# Patient Record
Sex: Male | Born: 1945
Health system: Southern US, Community
[De-identification: ages and names within clinical notes are randomized; demographics above are authoritative.]

## PROBLEM LIST (undated history)

## (undated) DIAGNOSIS — I10 Essential (primary) hypertension: Secondary | ICD-10-CM

## (undated) DIAGNOSIS — N189 Chronic kidney disease, unspecified: Secondary | ICD-10-CM

## (undated) HISTORY — DX: Chronic kidney disease, unspecified: N18.9

---

## 2005-11-10 ENCOUNTER — Ambulatory Visit (HOSPITAL_COMMUNITY): Admission: RE | Admit: 2005-11-10 | Discharge: 2005-11-10 | Payer: Self-pay | Admitting: *Deleted

## 2005-11-10 ENCOUNTER — Encounter (INDEPENDENT_AMBULATORY_CARE_PROVIDER_SITE_OTHER): Payer: Self-pay | Admitting: *Deleted

## 2006-11-23 ENCOUNTER — Emergency Department (HOSPITAL_COMMUNITY): Admission: EM | Admit: 2006-11-23 | Discharge: 2006-11-23 | Payer: Self-pay | Admitting: Emergency Medicine

## 2006-12-25 ENCOUNTER — Ambulatory Visit (HOSPITAL_BASED_OUTPATIENT_CLINIC_OR_DEPARTMENT_OTHER): Admission: RE | Admit: 2006-12-25 | Discharge: 2006-12-25 | Payer: Self-pay | Admitting: Urology

## 2007-02-26 ENCOUNTER — Encounter: Admission: RE | Admit: 2007-02-26 | Discharge: 2007-02-26 | Payer: Self-pay | Admitting: Internal Medicine

## 2007-09-26 ENCOUNTER — Encounter: Admission: RE | Admit: 2007-09-26 | Discharge: 2007-09-26 | Payer: Self-pay | Admitting: Internal Medicine

## 2008-08-04 ENCOUNTER — Encounter: Admission: RE | Admit: 2008-08-04 | Discharge: 2008-08-04 | Payer: Self-pay | Admitting: Internal Medicine

## 2009-02-09 ENCOUNTER — Ambulatory Visit (HOSPITAL_COMMUNITY): Admission: RE | Admit: 2009-02-09 | Discharge: 2009-02-09 | Payer: Self-pay | Admitting: Urology

## 2010-07-05 ENCOUNTER — Encounter: Payer: Self-pay | Admitting: Internal Medicine

## 2010-09-12 IMAGING — CT CT ABDOMEN WO/W CM
2 of 9 series · 13 of 46 positions shown, 18 images · IV contrast (agent unspecified)
Comparison: CT abdomen pelvis of 11/23/2006

CT ABDOMEN

CLINICAL DATA: Painless hematuria for 1 day, history of kidney
stones

CT ABDOMEN WITHOUT AND WITH CONTRAST
CT PELVIS WITH CONTRAST
TECHNIQUE: Multidetector CT imaging of the abdomen was performed
initially following the standard protocol before administration of
intravenous contrast.  Multidetector CT imaging of the abdomen and
pelvis was then performed following the standard protocol during
the bolus injection of intravenous contrast.
Contrast: 100 ml [id number]-5FF

[Series 5: routine abdomen · axial · 0.70mm/px · z∈[-382,-67]mm · 10 of 71 slices shown, 15 images]
[im 6/71  soft-tissue]
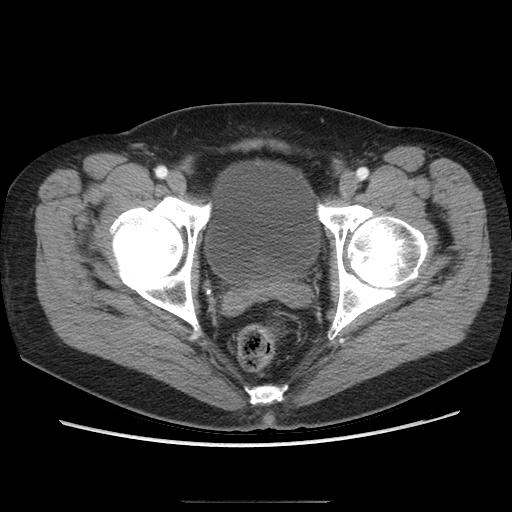
[im 6/71  bone]
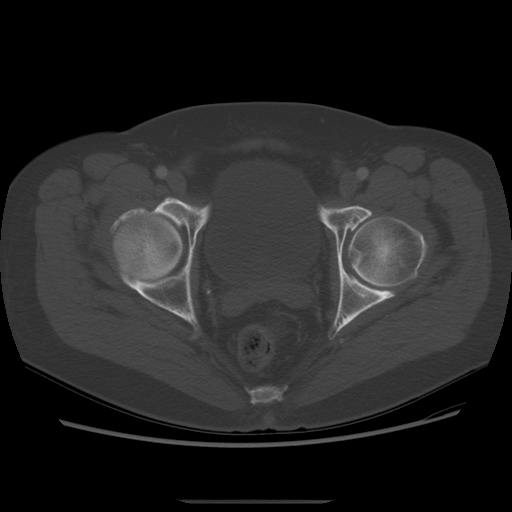
[im 12/71  soft-tissue]
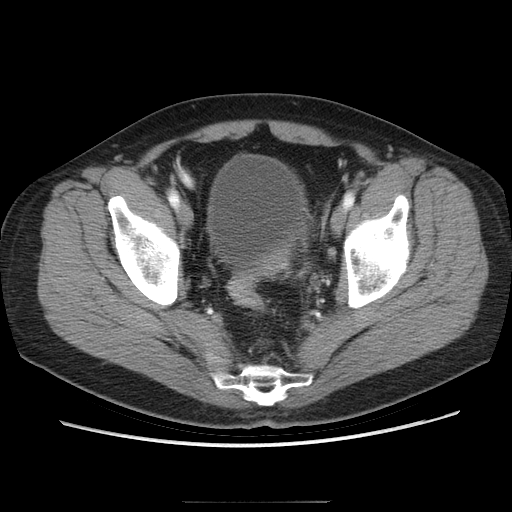
[im 24/71  soft-tissue]
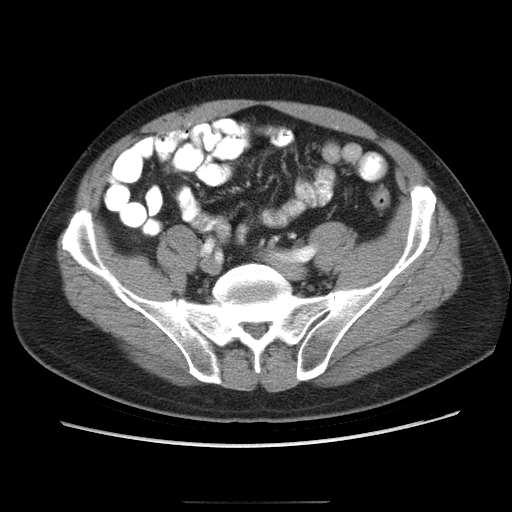
[im 30/71  soft-tissue]
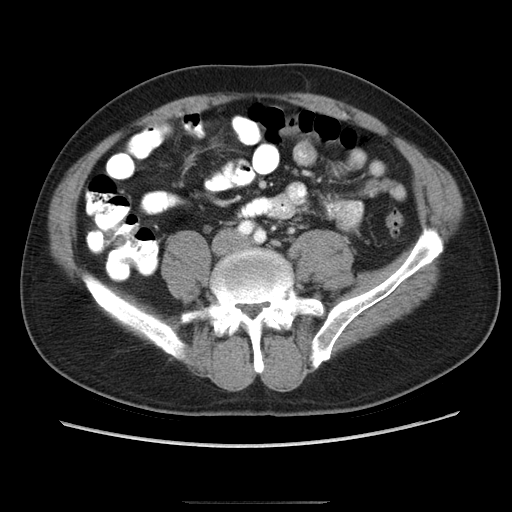
[im 36/71  soft-tissue]
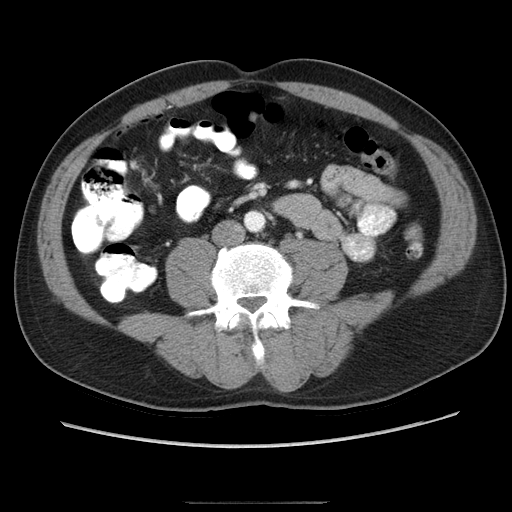
[im 41/71  soft-tissue]
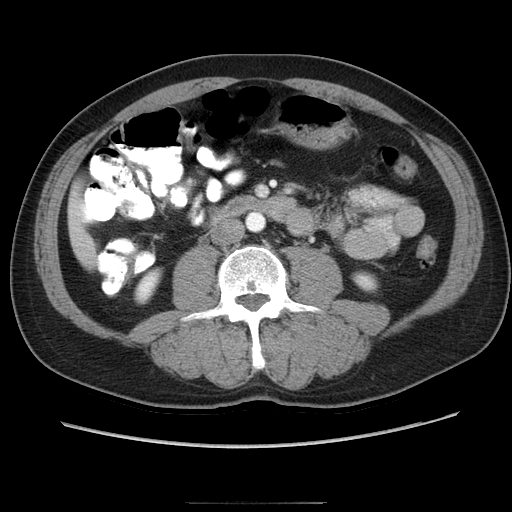
[im 47/71  soft-tissue]
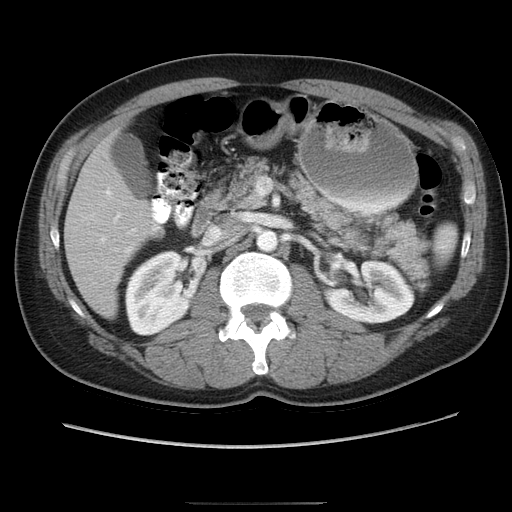
[im 47/71  lung]
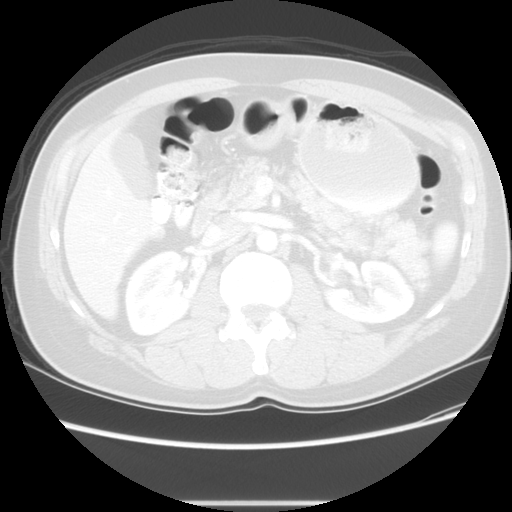
[im 53/71  lung]
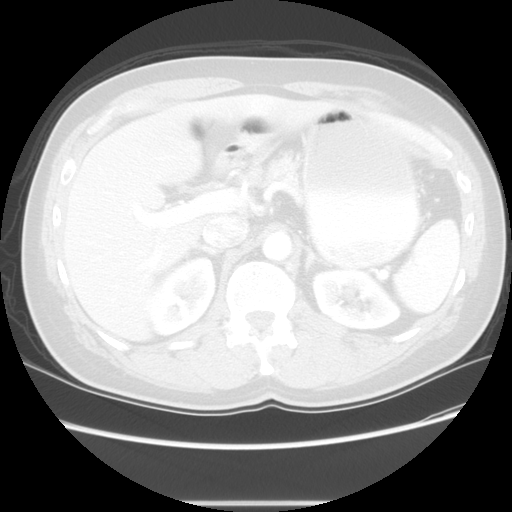
[im 59/71  soft-tissue]
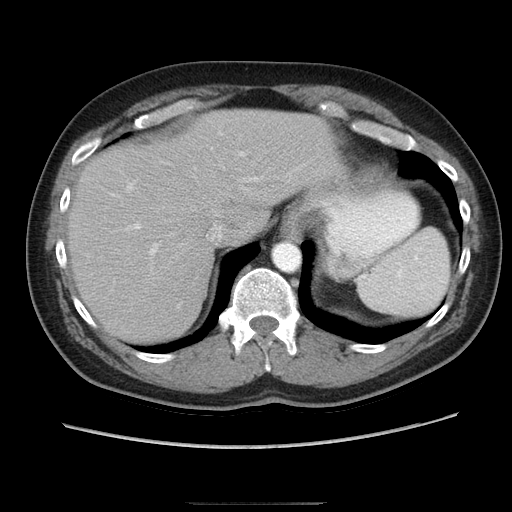
[im 59/71  lung]
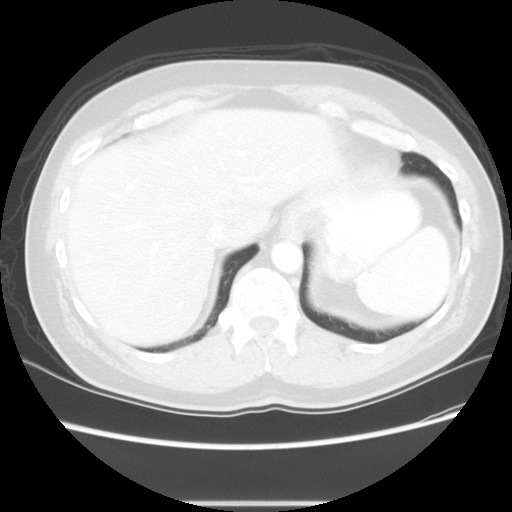
[im 65/71  soft-tissue]
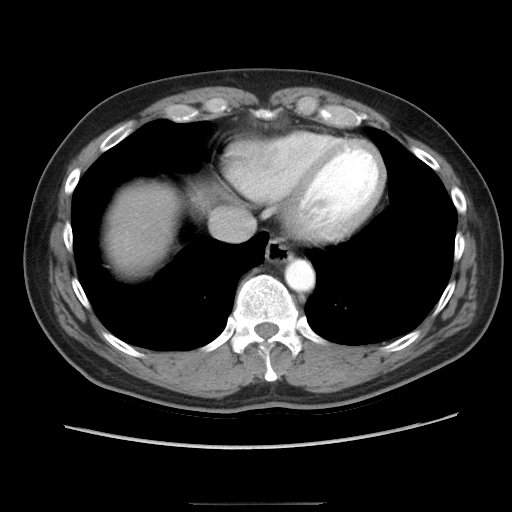
[im 65/71  lung]
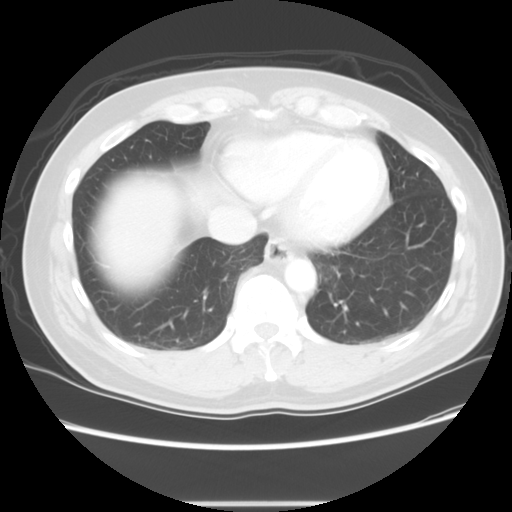
[im 65/71  bone]
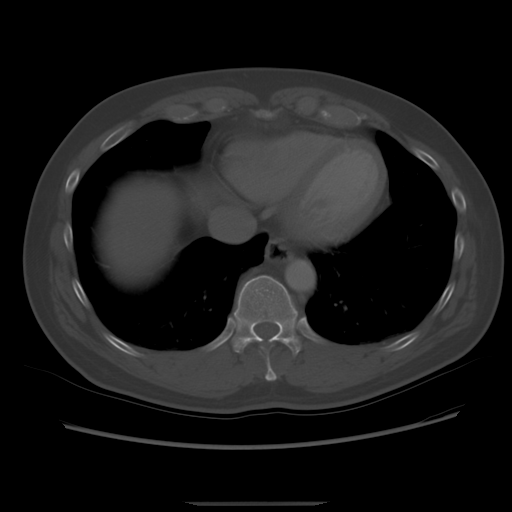

[Series 602: sagittal body · sagittal · 0.70mm/px · 3 of 144 slices shown]
[im 36/144  soft-tissue]
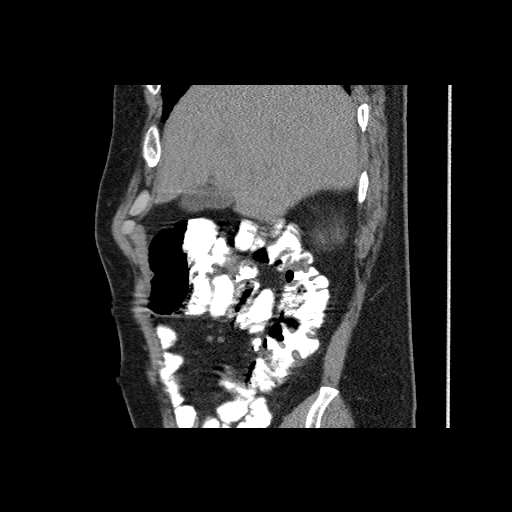
[im 72/144  soft-tissue]
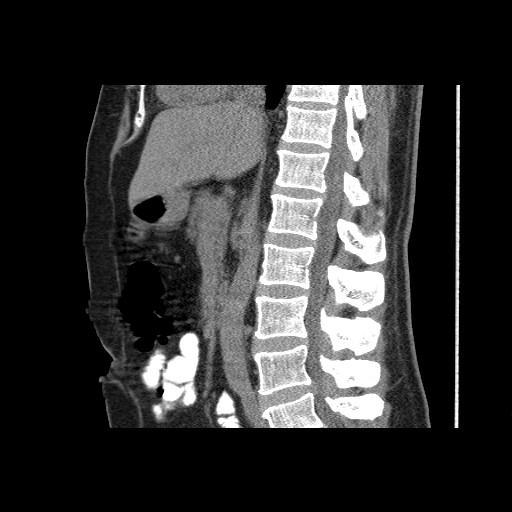
[im 108/144  soft-tissue]
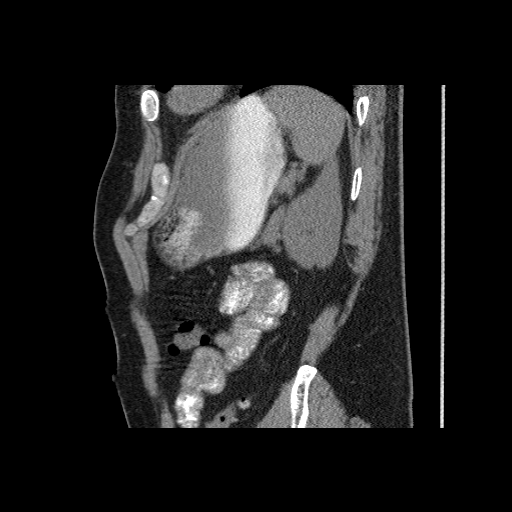

[13 of 46 positions shown; findings below may reference images not displayed]

FINDINGS: The lung bases are clear.  On the unenhanced images ,
there is a nonobstructing 5 mm calculus in the lower pole of the
left collecting system.  In addition there is an 8 x 6 mm calculus
within the left pelvis near the left ureter - pelvic junction which
creates very slight fullness on the left.  Only a tiny right renal
calculus is noted.

The liver enhances with no focal abnormality and no ductal
dilatation is seen.  No calcified gallstones are noted.  The
pancreas is normal in size, and the pancreatic duct is not dilated.
The adrenal glands and spleen appear normal.  The kidneys enhance
and and the previously noted left lower pole and left ureteropelvic
junction calculi are noted.  No renal mass is seen.  The abdominal
aorta is normal in caliber.  No adenopathy is seen.
IMPRESSION: 1.  8 x 6 mm calculus at the left ureter - pelvic junction creating
very low grade obstruction on the left.
2.  5 mm nonobstructing left lower pole renal calculus with tiny
right renal calculus as well.

CT PELVIS
FINDINGS: The ureters are normal in caliber and no distal ureteral
calculi are seen.  The prostate is slightly prominent.  There are
rectosigmoid colonic diverticula present with some thickening of
the mucosa most consistent with diverticulosis.  However with
slight pericolonic strandiness surrounding a portion of the sigmoid
colon, mild diverticulitis is a definite consideration.  The
urinary bladder is unremarkable.  No free fluid is seen. The
appendix and the terminal ileum are well visualized and appear
normal.
IMPRESSION: 1.  Mild diverticulitis of the sigmoid colon.  Multiple
rectosigmoid colonic diverticula.
2.  No distal ureteral calculi are seen.

## 2010-10-26 NOTE — Op Note (Signed)
[name], [name]                ACCOUNT NO.:  [id number]   MEDICAL RECORD NO.:  [id number]          PATIENT TYPE:  AMB   LOCATION:  [facility]                         FACILITY:  [facility]   PHYSICIAN:  Mark C. Vernie Ammons, M.D.  DATE OF BIRTH:  04-27-1946   DATE OF PROCEDURE:  12/25/2006  DATE OF DISCHARGE:                               OPERATIVE REPORT   PREOPERATIVE DIAGNOSIS:  Left distal ureteral calculus.   POSTOPERATIVE DIAGNOSIS:  Left distal ureteral calculus.   OPERATION PERFORMED:  Cystoscopy, left retrograde pyelogram with  interpretation, left ureteroscopy and stone extraction with double-J  stent placement.   SURGEON:  Mark C. Vernie Ammons, M.D.   ANESTHESIA:  General.   DRAINS:  4.8 French 26 cm double-J stent in left ureter (with string).   SPECIMENS:  Stone given to patient.   BLOOD LOSS:  None.   COMPLICATIONS:  None.   INDICATIONS FOR PROCEDURE:  The patient is a 65 year old male with a  left distal ureteral stone that has been present for several weeks and  has not progressed over time.  He has demonstrated hydronephrosis and we  discussed extraction of the stone, its risks and complications.  He has  elected to proceed.   DESCRIPTION OF PROCEDURE:  After informed consent the patient brought to  the major operating room, placed on table, administered general  anesthesia, then moved to the dorsal lithotomy position.  His genitalia  were sterilely prepped and draped.  Initially, a 22 French cystoscope  was introduced into the bladder with a 12 degree lens.  The urethra was  noted to be normal.  The prostatic urethra revealed some bilobar  hypertrophy but no lesions.  Upon entering the bladder, the bladder was  fully inspected and noted to be free of any tumor, stones or  inflammatory lesions.  Ureteral orifices normal in configuration and  position.   Left orifice was identified and a 0.038 inch floppy tip guidewire was  then passed up the left ureter under direct  fluoroscopic control.  The  stone also could be visualized easily in the ureter fluoroscopically.   The guidewire was left in place and I used the ureteral access sheath  and used the inner cannula to gently dilate the distal ureter.  After  doing this, I introduced a 6 French rigid ureteroscope per urethra under  direct visualization and then passed this up the left ureter easily.  I  was able to identify the stone.  It was photographed and then grasped  with Nitinol basket and extracted without difficulty.   I then reinserted the ureteroscope, noted no injury to the ureter and  therefore I advanced the scope slightly up the ureter.  I then performed  a retrograde pyelogram.   Full strength contrast was then injected through the ureteroscope to  perform the pyelogram.  This was done under direct fluoroscopic control  and revealed proximal hydronephrosis and some narrowing distally to  where the stone was located.  No filling defects or other abnormality  were noted proximally.   The guidewire was then passed through the ureteroscope again and  left in  place and the ureteroscope removed  and the cystoscope reinserted, back  loading this over the guidewire.  The stent was then passed over the  guidewire into the area of the renal pelvis.  Good curl was noted in the  renal pelvis and when the guidewire was removed and good curl was noted  in the bladder as well.  The bladder was then drained and the patient  was awakened and taken to recovery room in stable satisfactory  condition.  The patient tolerated the procedure well.  There were no  intraoperative complications.  The stent will need to be in place for  five to seven days.  He will then return to my office for its removal.  The string was left affixed.  He was given a prescription for 36  Pyridium plus and will take Vicodin HP one to two every four hours as  needed, #20.  He will also remain on his Flomax for now.  He will bring   his stone back for stone analysis when he returns.      Mark C. Vernie Ammons, M.D.  Electronically Signed     MCO/MEDQ  D:  12/25/2006  T:  12/26/2006  Job:  [id number]

## 2010-10-29 NOTE — Op Note (Signed)
[name], [name]                ACCOUNT NO.:  [id number]   MEDICAL RECORD NO.:  [id number]          PATIENT TYPE:  AMB   LOCATION:  ENDO                         FACILITY:  [facility]   PHYSICIAN:  Georgiana Spinner, M.D.    DATE OF BIRTH:  07/07/1945   DATE OF PROCEDURE:  11/10/2005  DATE OF DISCHARGE:                                 OPERATIVE REPORT   PROCEDURE:  Colonoscopy.   INDICATIONS:  Rectal bleeding colon polyps.   ANESTHESIA:  Demerol 60, Versed 5 mg.   PROCEDURE:  With the patient mildly sedated in the left lateral decubitus  position, a rectal exam was performed which was unremarkable.  Subsequently  the Olympus videoscopic colonoscope was inserted in the rectum and passed  under direct vision to the cecum identified by the ileocecal valve and  appendiceal orifice both of which were photographed.  There was a small  polyp in the cecum that was photographed and removed using hot biopsy  forceps technique setting of 20/200 blended current.  We then withdrew the  colonoscope taking circumferential views of the colonic mucosa stopping only  in the rectum which appeared normal on direct except for a small polyp that  was removed gain using hot biopsy forceps technique setting the same 20/200  blended current and appeared normal on retroflexed view. The endoscope was  straightened and withdrawn.  The patient's vital signs and pulse oximeter  remained stable.  The patient tolerated the procedure well without apparent  complications.   FINDINGS:  Two small polyps, one in the rectum and one in the cecum,  otherwise an unremarkable examination.  Await biopsy report.  The patient  will call me for results and follow-up with me as an outpatient.           ______________________________  Georgiana Spinner, M.D.     GMO/MEDQ  D:  11/10/2005  T:  11/10/2005  Job:  [id number]

## 2010-11-23 HISTORY — PX: IRRIGATION AND DEBRIDEMENT SEBACEOUS CYST: SHX5255

## 2010-12-07 ENCOUNTER — Ambulatory Visit (INDEPENDENT_AMBULATORY_CARE_PROVIDER_SITE_OTHER): Payer: BC Managed Care – PPO | Admitting: General Surgery

## 2010-12-07 ENCOUNTER — Encounter (INDEPENDENT_AMBULATORY_CARE_PROVIDER_SITE_OTHER): Payer: Self-pay | Admitting: General Surgery

## 2010-12-07 VITALS — BP 130/76 | HR 78 | Temp 97.2°F | Resp 18 | Wt 173.0 lb

## 2010-12-07 DIAGNOSIS — L723 Sebaceous cyst: Secondary | ICD-10-CM

## 2010-12-07 NOTE — Patient Instructions (Signed)
Return to all normal activities!

## 2010-12-08 ENCOUNTER — Encounter (INDEPENDENT_AMBULATORY_CARE_PROVIDER_SITE_OTHER): Payer: Self-pay | Admitting: General Surgery

## 2010-12-08 NOTE — Progress Notes (Signed)
Subjective:     Patient ID: [name], male   DOB: 11/14/1945, 65 y.o.   MRN: [id number]    BP 130/76  Pulse 78  Temp(Src) 97.2 F (36.2 C) (Temporal)  Resp 18  Wt 173 lb (78.472 kg)    HPI Postop from an excision of a sebaceous cyst from the left neck. Doing well. No complaints.  Review of Systems     Objective:   Physical Exam Incision has healed well    Assessment:     Postop    Plan:     May return to all normal activities.

## 2010-12-10 ENCOUNTER — Encounter (INDEPENDENT_AMBULATORY_CARE_PROVIDER_SITE_OTHER): Payer: Self-pay | Admitting: General Surgery

## 2010-12-13 ENCOUNTER — Encounter (INDEPENDENT_AMBULATORY_CARE_PROVIDER_SITE_OTHER): Payer: Self-pay | Admitting: General Surgery

## 2010-12-16 NOTE — Progress Notes (Signed)
This encounter was created in error - please disregard.

## 2011-03-20 IMAGING — CR DG ABDOMEN 1V
1 series · 1 of 1 positions shown · non-contrast
Comparison: CT 08/04/2008

CLINICAL DATA: Left UPJ stone.

ABDOMEN - 1 VIEW

[t abdomen supine]
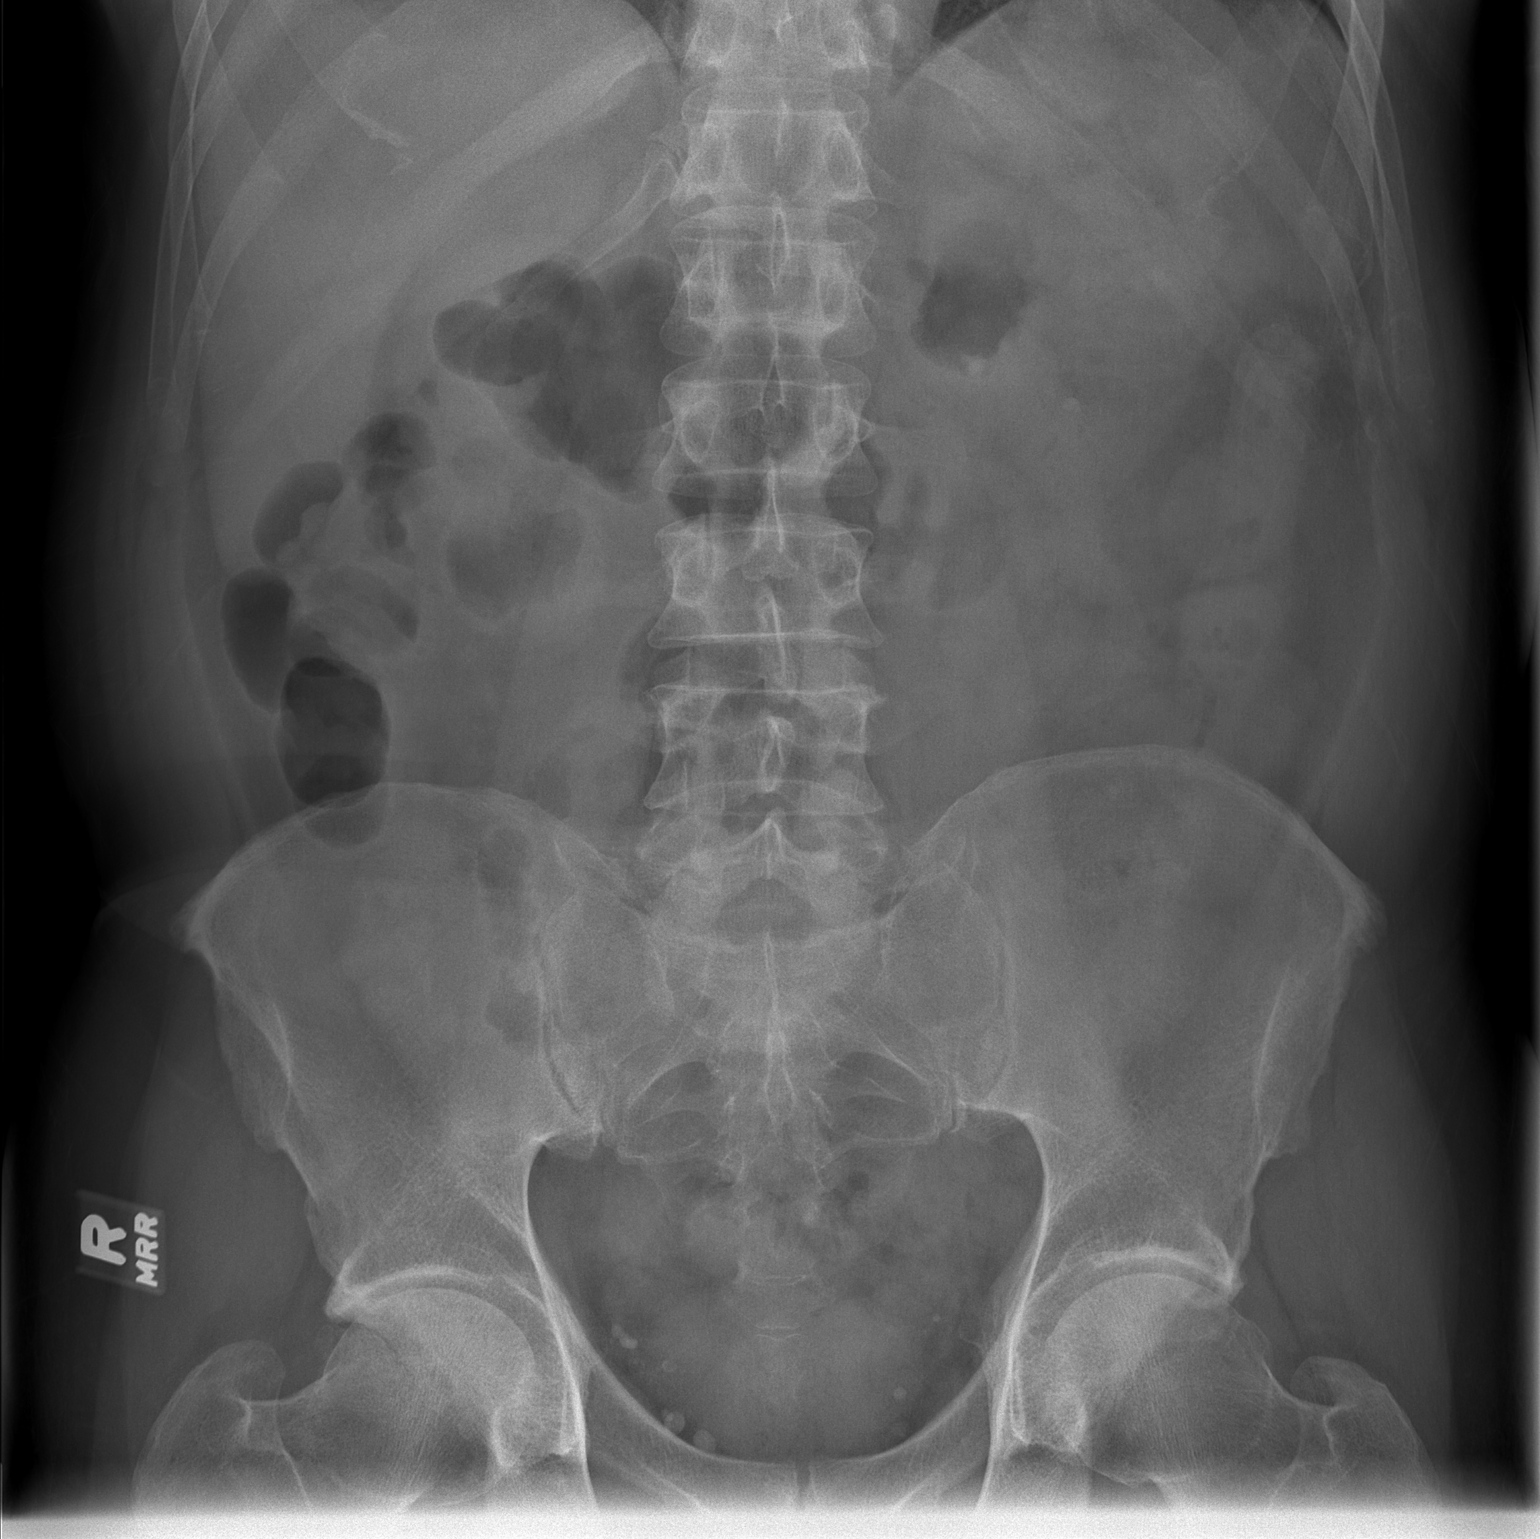

[1 of 1 positions shown; findings below may reference images not displayed]

FINDINGS: 5 mm stone projects over the lower pole of the left
kidney.  6 mm stone projects in the region of the left UPJ.  No
additional suspicious calcifications.  Bowel gas pattern is normal.
IMPRESSION: Left UPJ and left lower pole stone as described above.

## 2011-03-29 LAB — POCT HEMOGLOBIN-HEMACUE
Hemoglobin: 14.6
Operator id: 268271

## 2011-03-31 LAB — COMPREHENSIVE METABOLIC PANEL
ALT: 11
AST: 21
CO2: 26
Calcium: 8.8
GFR calc Af Amer: 56 — ABNORMAL LOW
Sodium: 136
Total Protein: 6.8

## 2011-03-31 LAB — CBC
MCHC: 33.3
RBC: 4.85
RDW: 13.2

## 2011-03-31 LAB — URINE MICROSCOPIC-ADD ON

## 2011-03-31 LAB — URINALYSIS, ROUTINE W REFLEX MICROSCOPIC
Bilirubin Urine: NEGATIVE
Nitrite: NEGATIVE
Specific Gravity, Urine: 1.02
pH: 7

## 2011-03-31 LAB — DIFFERENTIAL
Eosinophils Absolute: 0.3
Eosinophils Relative: 2
Lymphs Abs: 0.9
Monocytes Absolute: 0.6
Monocytes Relative: 4

## 2011-11-08 DIAGNOSIS — Z0000 Encounter for general adult medical examination without abnormal findings: Secondary | ICD-10-CM | POA: Diagnosis not present

## 2011-11-08 DIAGNOSIS — Z125 Encounter for screening for malignant neoplasm of prostate: Secondary | ICD-10-CM | POA: Diagnosis not present

## 2011-12-22 ENCOUNTER — Other Ambulatory Visit: Payer: Self-pay | Admitting: Gastroenterology

## 2011-12-22 DIAGNOSIS — Z1211 Encounter for screening for malignant neoplasm of colon: Secondary | ICD-10-CM | POA: Diagnosis not present

## 2011-12-22 DIAGNOSIS — D126 Benign neoplasm of colon, unspecified: Secondary | ICD-10-CM | POA: Diagnosis not present

## 2011-12-22 DIAGNOSIS — K5730 Diverticulosis of large intestine without perforation or abscess without bleeding: Secondary | ICD-10-CM | POA: Diagnosis not present

## 2012-11-26 DIAGNOSIS — Z0000 Encounter for general adult medical examination without abnormal findings: Secondary | ICD-10-CM | POA: Diagnosis not present

## 2012-11-26 DIAGNOSIS — Z125 Encounter for screening for malignant neoplasm of prostate: Secondary | ICD-10-CM | POA: Diagnosis not present

## 2012-12-20 DIAGNOSIS — R5381 Other malaise: Secondary | ICD-10-CM | POA: Diagnosis not present

## 2012-12-20 DIAGNOSIS — G43909 Migraine, unspecified, not intractable, without status migrainosus: Secondary | ICD-10-CM | POA: Diagnosis not present

## 2012-12-20 DIAGNOSIS — R5383 Other fatigue: Secondary | ICD-10-CM | POA: Diagnosis not present

## 2012-12-20 DIAGNOSIS — M1990 Unspecified osteoarthritis, unspecified site: Secondary | ICD-10-CM | POA: Diagnosis not present

## 2014-01-14 DIAGNOSIS — H43819 Vitreous degeneration, unspecified eye: Secondary | ICD-10-CM | POA: Diagnosis not present

## 2014-01-21 DIAGNOSIS — R5381 Other malaise: Secondary | ICD-10-CM | POA: Diagnosis not present

## 2014-01-21 DIAGNOSIS — Z0000 Encounter for general adult medical examination without abnormal findings: Secondary | ICD-10-CM | POA: Diagnosis not present

## 2014-01-21 DIAGNOSIS — R5383 Other fatigue: Secondary | ICD-10-CM | POA: Diagnosis not present

## 2014-01-21 DIAGNOSIS — G43909 Migraine, unspecified, not intractable, without status migrainosus: Secondary | ICD-10-CM | POA: Diagnosis not present

## 2014-01-21 DIAGNOSIS — Z125 Encounter for screening for malignant neoplasm of prostate: Secondary | ICD-10-CM | POA: Diagnosis not present

## 2014-01-27 DIAGNOSIS — G43909 Migraine, unspecified, not intractable, without status migrainosus: Secondary | ICD-10-CM | POA: Diagnosis not present

## 2014-01-27 DIAGNOSIS — Z23 Encounter for immunization: Secondary | ICD-10-CM | POA: Diagnosis not present

## 2014-01-27 DIAGNOSIS — M1990 Unspecified osteoarthritis, unspecified site: Secondary | ICD-10-CM | POA: Diagnosis not present

## 2014-01-27 DIAGNOSIS — Z0000 Encounter for general adult medical examination without abnormal findings: Secondary | ICD-10-CM | POA: Diagnosis not present

## 2014-02-25 DIAGNOSIS — H2510 Age-related nuclear cataract, unspecified eye: Secondary | ICD-10-CM | POA: Diagnosis not present

## 2014-02-25 DIAGNOSIS — H43819 Vitreous degeneration, unspecified eye: Secondary | ICD-10-CM | POA: Diagnosis not present

## 2014-07-01 DIAGNOSIS — H2513 Age-related nuclear cataract, bilateral: Secondary | ICD-10-CM | POA: Diagnosis not present

## 2014-07-29 DIAGNOSIS — I10 Essential (primary) hypertension: Secondary | ICD-10-CM | POA: Diagnosis not present

## 2014-07-29 DIAGNOSIS — G43009 Migraine without aura, not intractable, without status migrainosus: Secondary | ICD-10-CM | POA: Diagnosis not present

## 2014-10-27 DIAGNOSIS — G43009 Migraine without aura, not intractable, without status migrainosus: Secondary | ICD-10-CM | POA: Diagnosis not present

## 2014-10-27 DIAGNOSIS — I10 Essential (primary) hypertension: Secondary | ICD-10-CM | POA: Diagnosis not present

## 2015-02-20 DIAGNOSIS — G43009 Migraine without aura, not intractable, without status migrainosus: Secondary | ICD-10-CM | POA: Diagnosis not present

## 2015-02-20 DIAGNOSIS — I10 Essential (primary) hypertension: Secondary | ICD-10-CM | POA: Diagnosis not present

## 2015-02-20 DIAGNOSIS — Z125 Encounter for screening for malignant neoplasm of prostate: Secondary | ICD-10-CM | POA: Diagnosis not present

## 2015-02-26 DIAGNOSIS — I10 Essential (primary) hypertension: Secondary | ICD-10-CM | POA: Diagnosis not present

## 2015-02-26 DIAGNOSIS — R5383 Other fatigue: Secondary | ICD-10-CM | POA: Diagnosis not present

## 2015-02-26 DIAGNOSIS — G43009 Migraine without aura, not intractable, without status migrainosus: Secondary | ICD-10-CM | POA: Diagnosis not present

## 2015-05-14 DIAGNOSIS — J069 Acute upper respiratory infection, unspecified: Secondary | ICD-10-CM | POA: Diagnosis not present

## 2015-05-14 DIAGNOSIS — R05 Cough: Secondary | ICD-10-CM | POA: Diagnosis not present

## 2015-05-26 DIAGNOSIS — H6983 Other specified disorders of Eustachian tube, bilateral: Secondary | ICD-10-CM | POA: Diagnosis not present

## 2015-07-27 DIAGNOSIS — J069 Acute upper respiratory infection, unspecified: Secondary | ICD-10-CM | POA: Diagnosis not present

## 2016-03-17 DIAGNOSIS — R5383 Other fatigue: Secondary | ICD-10-CM | POA: Diagnosis not present

## 2016-03-17 DIAGNOSIS — Z125 Encounter for screening for malignant neoplasm of prostate: Secondary | ICD-10-CM | POA: Diagnosis not present

## 2016-03-17 DIAGNOSIS — I10 Essential (primary) hypertension: Secondary | ICD-10-CM | POA: Diagnosis not present

## 2016-03-17 DIAGNOSIS — Z0000 Encounter for general adult medical examination without abnormal findings: Secondary | ICD-10-CM | POA: Diagnosis not present

## 2016-03-17 DIAGNOSIS — G43009 Migraine without aura, not intractable, without status migrainosus: Secondary | ICD-10-CM | POA: Diagnosis not present

## 2016-03-17 DIAGNOSIS — Z23 Encounter for immunization: Secondary | ICD-10-CM | POA: Diagnosis not present

## 2016-03-24 DIAGNOSIS — G43009 Migraine without aura, not intractable, without status migrainosus: Secondary | ICD-10-CM | POA: Diagnosis not present

## 2016-03-24 DIAGNOSIS — M150 Primary generalized (osteo)arthritis: Secondary | ICD-10-CM | POA: Diagnosis not present

## 2016-03-24 DIAGNOSIS — R5383 Other fatigue: Secondary | ICD-10-CM | POA: Diagnosis not present

## 2016-03-24 DIAGNOSIS — Z23 Encounter for immunization: Secondary | ICD-10-CM | POA: Diagnosis not present

## 2016-03-24 DIAGNOSIS — I10 Essential (primary) hypertension: Secondary | ICD-10-CM | POA: Diagnosis not present

## 2017-04-06 DIAGNOSIS — R5383 Other fatigue: Secondary | ICD-10-CM | POA: Diagnosis not present

## 2017-04-06 DIAGNOSIS — Z23 Encounter for immunization: Secondary | ICD-10-CM | POA: Diagnosis not present

## 2017-04-06 DIAGNOSIS — I10 Essential (primary) hypertension: Secondary | ICD-10-CM | POA: Diagnosis not present

## 2017-04-06 DIAGNOSIS — Z0000 Encounter for general adult medical examination without abnormal findings: Secondary | ICD-10-CM | POA: Diagnosis not present

## 2017-04-06 DIAGNOSIS — Z125 Encounter for screening for malignant neoplasm of prostate: Secondary | ICD-10-CM | POA: Diagnosis not present

## 2017-04-13 DIAGNOSIS — I10 Essential (primary) hypertension: Secondary | ICD-10-CM | POA: Diagnosis not present

## 2017-04-13 DIAGNOSIS — R5383 Other fatigue: Secondary | ICD-10-CM | POA: Diagnosis not present

## 2017-04-13 DIAGNOSIS — M150 Primary generalized (osteo)arthritis: Secondary | ICD-10-CM | POA: Diagnosis not present

## 2017-04-13 DIAGNOSIS — N529 Male erectile dysfunction, unspecified: Secondary | ICD-10-CM | POA: Diagnosis not present

## 2017-04-13 DIAGNOSIS — G43009 Migraine without aura, not intractable, without status migrainosus: Secondary | ICD-10-CM | POA: Diagnosis not present

## 2017-08-08 DIAGNOSIS — D126 Benign neoplasm of colon, unspecified: Secondary | ICD-10-CM | POA: Diagnosis not present

## 2017-08-08 DIAGNOSIS — K5730 Diverticulosis of large intestine without perforation or abscess without bleeding: Secondary | ICD-10-CM | POA: Diagnosis not present

## 2017-08-08 DIAGNOSIS — Z86010 Personal history of colonic polyps: Secondary | ICD-10-CM | POA: Diagnosis not present

## 2017-08-11 DIAGNOSIS — D126 Benign neoplasm of colon, unspecified: Secondary | ICD-10-CM | POA: Diagnosis not present

## 2018-03-30 DIAGNOSIS — Z23 Encounter for immunization: Secondary | ICD-10-CM | POA: Diagnosis not present

## 2018-05-09 DIAGNOSIS — Z125 Encounter for screening for malignant neoplasm of prostate: Secondary | ICD-10-CM | POA: Diagnosis not present

## 2018-05-09 DIAGNOSIS — I10 Essential (primary) hypertension: Secondary | ICD-10-CM | POA: Diagnosis not present

## 2018-05-09 DIAGNOSIS — R5383 Other fatigue: Secondary | ICD-10-CM | POA: Diagnosis not present

## 2018-05-09 DIAGNOSIS — Z0000 Encounter for general adult medical examination without abnormal findings: Secondary | ICD-10-CM | POA: Diagnosis not present

## 2018-05-09 DIAGNOSIS — G43009 Migraine without aura, not intractable, without status migrainosus: Secondary | ICD-10-CM | POA: Diagnosis not present

## 2018-05-17 DIAGNOSIS — M150 Primary generalized (osteo)arthritis: Secondary | ICD-10-CM | POA: Diagnosis not present

## 2018-05-17 DIAGNOSIS — R5383 Other fatigue: Secondary | ICD-10-CM | POA: Diagnosis not present

## 2018-05-17 DIAGNOSIS — I10 Essential (primary) hypertension: Secondary | ICD-10-CM | POA: Diagnosis not present

## 2018-05-17 DIAGNOSIS — G43009 Migraine without aura, not intractable, without status migrainosus: Secondary | ICD-10-CM | POA: Diagnosis not present

## 2019-01-18 DIAGNOSIS — Z87891 Personal history of nicotine dependence: Secondary | ICD-10-CM | POA: Diagnosis not present

## 2019-01-18 DIAGNOSIS — Z8249 Family history of ischemic heart disease and other diseases of the circulatory system: Secondary | ICD-10-CM | POA: Diagnosis not present

## 2019-01-18 DIAGNOSIS — M62838 Other muscle spasm: Secondary | ICD-10-CM | POA: Diagnosis not present

## 2019-01-18 DIAGNOSIS — R32 Unspecified urinary incontinence: Secondary | ICD-10-CM | POA: Diagnosis not present

## 2019-01-18 DIAGNOSIS — Z7982 Long term (current) use of aspirin: Secondary | ICD-10-CM | POA: Diagnosis not present

## 2019-01-18 DIAGNOSIS — G43909 Migraine, unspecified, not intractable, without status migrainosus: Secondary | ICD-10-CM | POA: Diagnosis not present

## 2019-01-18 DIAGNOSIS — Z809 Family history of malignant neoplasm, unspecified: Secondary | ICD-10-CM | POA: Diagnosis not present

## 2019-01-18 DIAGNOSIS — Z833 Family history of diabetes mellitus: Secondary | ICD-10-CM | POA: Diagnosis not present

## 2019-01-18 DIAGNOSIS — I10 Essential (primary) hypertension: Secondary | ICD-10-CM | POA: Diagnosis not present

## 2019-02-23 DIAGNOSIS — Z20828 Contact with and (suspected) exposure to other viral communicable diseases: Secondary | ICD-10-CM | POA: Diagnosis not present

## 2019-02-25 DIAGNOSIS — Z23 Encounter for immunization: Secondary | ICD-10-CM | POA: Diagnosis not present

## 2019-03-31 DIAGNOSIS — Z20828 Contact with and (suspected) exposure to other viral communicable diseases: Secondary | ICD-10-CM | POA: Diagnosis not present

## 2019-03-31 DIAGNOSIS — R05 Cough: Secondary | ICD-10-CM | POA: Diagnosis not present

## 2019-04-01 DIAGNOSIS — Z20828 Contact with and (suspected) exposure to other viral communicable diseases: Secondary | ICD-10-CM | POA: Diagnosis not present

## 2019-05-16 DIAGNOSIS — I10 Essential (primary) hypertension: Secondary | ICD-10-CM | POA: Diagnosis not present

## 2019-05-16 DIAGNOSIS — Z0000 Encounter for general adult medical examination without abnormal findings: Secondary | ICD-10-CM | POA: Diagnosis not present

## 2019-05-16 DIAGNOSIS — Z23 Encounter for immunization: Secondary | ICD-10-CM | POA: Diagnosis not present

## 2019-05-16 DIAGNOSIS — R5383 Other fatigue: Secondary | ICD-10-CM | POA: Diagnosis not present

## 2019-05-16 DIAGNOSIS — G43009 Migraine without aura, not intractable, without status migrainosus: Secondary | ICD-10-CM | POA: Diagnosis not present

## 2019-05-16 DIAGNOSIS — M150 Primary generalized (osteo)arthritis: Secondary | ICD-10-CM | POA: Diagnosis not present

## 2019-05-23 DIAGNOSIS — R5383 Other fatigue: Secondary | ICD-10-CM | POA: Diagnosis not present

## 2019-05-23 DIAGNOSIS — I10 Essential (primary) hypertension: Secondary | ICD-10-CM | POA: Diagnosis not present

## 2019-05-23 DIAGNOSIS — Z0000 Encounter for general adult medical examination without abnormal findings: Secondary | ICD-10-CM | POA: Diagnosis not present

## 2019-05-23 DIAGNOSIS — M150 Primary generalized (osteo)arthritis: Secondary | ICD-10-CM | POA: Diagnosis not present

## 2019-05-23 DIAGNOSIS — G43009 Migraine without aura, not intractable, without status migrainosus: Secondary | ICD-10-CM | POA: Diagnosis not present

## 2020-01-06 DIAGNOSIS — R69 Illness, unspecified: Secondary | ICD-10-CM | POA: Diagnosis not present

## 2020-02-03 DIAGNOSIS — I10 Essential (primary) hypertension: Secondary | ICD-10-CM | POA: Diagnosis not present

## 2020-02-03 DIAGNOSIS — N529 Male erectile dysfunction, unspecified: Secondary | ICD-10-CM | POA: Diagnosis not present

## 2020-02-03 DIAGNOSIS — Z809 Family history of malignant neoplasm, unspecified: Secondary | ICD-10-CM | POA: Diagnosis not present

## 2020-02-03 DIAGNOSIS — Z7982 Long term (current) use of aspirin: Secondary | ICD-10-CM | POA: Diagnosis not present

## 2020-02-03 DIAGNOSIS — Z8249 Family history of ischemic heart disease and other diseases of the circulatory system: Secondary | ICD-10-CM | POA: Diagnosis not present

## 2020-02-03 DIAGNOSIS — Z823 Family history of stroke: Secondary | ICD-10-CM | POA: Diagnosis not present

## 2020-02-03 DIAGNOSIS — R32 Unspecified urinary incontinence: Secondary | ICD-10-CM | POA: Diagnosis not present

## 2020-02-03 DIAGNOSIS — K5900 Constipation, unspecified: Secondary | ICD-10-CM | POA: Diagnosis not present

## 2020-02-03 DIAGNOSIS — M545 Low back pain: Secondary | ICD-10-CM | POA: Diagnosis not present

## 2020-02-03 DIAGNOSIS — G43109 Migraine with aura, not intractable, without status migrainosus: Secondary | ICD-10-CM | POA: Diagnosis not present

## 2020-03-13 DIAGNOSIS — Z23 Encounter for immunization: Secondary | ICD-10-CM | POA: Diagnosis not present

## 2020-03-17 ENCOUNTER — Other Ambulatory Visit: Payer: Self-pay

## 2020-03-17 ENCOUNTER — Ambulatory Visit
Admission: EM | Admit: 2020-03-17 | Discharge: 2020-03-17 | Disposition: A | Discharge status: Home / Self Care | Payer: Medicare HMO

## 2020-03-17 DIAGNOSIS — R1012 Left upper quadrant pain: Secondary | ICD-10-CM

## 2020-03-17 NOTE — Discharge Instructions (Addendum)
Continue tylenol. Look for rash.  If one develops come back for evaluation.

## 2020-03-17 NOTE — ED Triage Notes (Addendum)
Pt presents with complaints of left upper abdominal pain that is sharp and started this morning. Area is tender to touch. Pt denies any nausea or diarrhea. Reports the pain improved with tylenol today but it came back this evening feeling worse. Pt reports also trying a muscle relaxer with no relief.

## 2020-03-17 NOTE — ED Provider Notes (Signed)
[facility]    CSN: [id number] Arrival date & time: 03/17/20  1812      History   Chief Complaint Chief Complaint  Patient presents with  . Abdominal Pain    HPI [name] is a 74 y.o. male  Presenting for LUQ pain that radiates around to back.  Denies trauma or inciting event.  No rash, fever, arthralgias, myalgias.  Denies nausea, vomiting, history of this.  Does endorse history of chickenpox.  His shingles vaccinated as of a few years ago.  Has tried muscle relaxer without relief.  Past Medical History:  Diagnosis Date  . Chronic kidney disease   . Migraine     There are no problems to display for this patient.   Past Surgical History:  Procedure Laterality Date  . IRRIGATION AND DEBRIDEMENT SEBACEOUS CYST  11/23/2010   sebaceous cyst on left neck       Home Medications    Prior to Admission medications   Medication Sig Start Date End Date Taking? Authorizing Provider  aspirin 81 MG tablet Take 325 mg by mouth daily.      [provider]  SUMAtriptan (IMITREX) 25 MG tablet Take 25 mg by mouth every 2 (two) hours as needed.      [provider]    Family History Family History  Problem Relation Age of Onset  . Diabetes Mother   . Hypertension Mother   . Cancer Father   . Heart disease Father   . Heart disease Brother     Social History Social History   Tobacco Use  . Smoking status: Never Smoker  . Smokeless tobacco: Never Used  Substance Use Topics  . Alcohol use: No  . Drug use: Not on file     Allergies   Patient has no known allergies.   Review of Systems As per HPI   Physical Exam Triage Vital Signs ED Triage Vitals  Enc Vitals Group     BP 03/17/20 1826 119/75     Pulse Rate 03/17/20 1826 89     Resp 03/17/20 1826 14     Temp 03/17/20 1826 99.1 F (37.3 C)     Temp Source 03/17/20 1826 Oral     SpO2 03/17/20 1826 95 %     Weight --      Height --      Head Circumference --      Peak  Flow --      Pain Score 03/17/20 1832 8     Pain Loc --      Pain Edu? --      Excl. in [place]? --    No data found.  Updated Vital Signs BP 119/75 (BP Location: Left Arm)   Pulse 89   Temp 99.1 F (37.3 C) (Oral)   Resp 14   SpO2 95%   Visual Acuity Right Eye Distance:   Left Eye Distance:   Bilateral Distance:    Right Eye Near:   Left Eye Near:    Bilateral Near:     Physical Exam Constitutional:      General: He is not in acute distress. HENT:     Head: Normocephalic and atraumatic.  Eyes:     General: No scleral icterus.    Pupils: Pupils are equal, round, and reactive to light.  Cardiovascular:     Rate and Rhythm: Normal rate.  Pulmonary:     Effort: Pulmonary effort is normal. No respiratory distress.  Breath sounds: No wheezing.  Abdominal:     General: Abdomen is flat. Bowel sounds are normal.     Palpations: There is no hepatomegaly or splenomegaly.     Tenderness: There is abdominal tenderness in the left upper quadrant.     Comments: Mild.  No mass, crepitus.  Skin:    Coloration: Skin is not jaundiced or pale.     Findings: No rash.  Neurological:     Mental Status: He is alert and oriented to person, place, and time.      UC Treatments / Results  Labs (all labs ordered are listed, but only abnormal results are displayed) Labs Reviewed - No data to display  EKG   Radiology No results found.  Procedures Procedures (including critical care time)  Medications Ordered in UC Medications - No data to display  Initial Impression / Assessment and Plan / UC Course  I have reviewed the triage vital signs and the nursing notes.  Pertinent labs & imaging results that were available during my care of the patient were reviewed by me and considered in my medical decision making (see chart for details).     Afebrile, nontoxic.  Low concern for acute process at this time.  Discussed possibility of shingles: We will watch for rash, return if 1  appears.  Return precautions discussed, pt verbalized understanding and is agreeable to plan. Final Clinical Impressions(s) / UC Diagnoses   Final diagnoses:  LUQ pain     Discharge Instructions     Continue tylenol. Look for rash.  If one develops come back for evaluation.    ED Prescriptions    None     PDMP not reviewed this encounter.   Neldon [place], [place] 03/17/20 1934

## 2020-03-20 DIAGNOSIS — N3943 Post-void dribbling: Secondary | ICD-10-CM | POA: Diagnosis not present

## 2020-03-20 DIAGNOSIS — R1012 Left upper quadrant pain: Secondary | ICD-10-CM | POA: Diagnosis not present

## 2020-03-20 DIAGNOSIS — N401 Enlarged prostate with lower urinary tract symptoms: Secondary | ICD-10-CM | POA: Diagnosis not present

## 2020-04-01 DIAGNOSIS — R69 Illness, unspecified: Secondary | ICD-10-CM | POA: Diagnosis not present

## 2020-04-20 ENCOUNTER — Ambulatory Visit: Payer: Medicare HMO | Attending: Internal Medicine

## 2020-04-20 DIAGNOSIS — Z23 Encounter for immunization: Secondary | ICD-10-CM

## 2020-04-20 NOTE — Progress Notes (Signed)
   Covid-19 Vaccination Clinic  Name:  [name]    MRN: [id number] DOB: March 06, 1946  04/20/2020  [name] was observed post Covid-19 immunization for 15 minutes without incident. He was provided with Vaccine Information Sheet and instruction to access the V-Safe system.   [name] was instructed to call 911 with any severe reactions post vaccine: Marland Kitchen Difficulty breathing  . Swelling of face and throat  . A fast heartbeat  . A bad rash all over body  . Dizziness and weakness

## 2020-05-21 DIAGNOSIS — I10 Essential (primary) hypertension: Secondary | ICD-10-CM | POA: Diagnosis not present

## 2020-05-21 DIAGNOSIS — Z0000 Encounter for general adult medical examination without abnormal findings: Secondary | ICD-10-CM | POA: Diagnosis not present

## 2020-05-28 DIAGNOSIS — N401 Enlarged prostate with lower urinary tract symptoms: Secondary | ICD-10-CM | POA: Diagnosis not present

## 2020-05-28 DIAGNOSIS — G43009 Migraine without aura, not intractable, without status migrainosus: Secondary | ICD-10-CM | POA: Diagnosis not present

## 2020-05-28 DIAGNOSIS — N3943 Post-void dribbling: Secondary | ICD-10-CM | POA: Diagnosis not present

## 2020-05-28 DIAGNOSIS — M150 Primary generalized (osteo)arthritis: Secondary | ICD-10-CM | POA: Diagnosis not present

## 2020-05-28 DIAGNOSIS — Z0000 Encounter for general adult medical examination without abnormal findings: Secondary | ICD-10-CM | POA: Diagnosis not present

## 2020-05-28 DIAGNOSIS — I10 Essential (primary) hypertension: Secondary | ICD-10-CM | POA: Diagnosis not present

## 2020-06-30 DIAGNOSIS — M25571 Pain in right ankle and joints of right foot: Secondary | ICD-10-CM | POA: Diagnosis not present

## 2020-09-18 DIAGNOSIS — N529 Male erectile dysfunction, unspecified: Secondary | ICD-10-CM | POA: Diagnosis not present

## 2020-09-18 DIAGNOSIS — R32 Unspecified urinary incontinence: Secondary | ICD-10-CM | POA: Diagnosis not present

## 2020-09-18 DIAGNOSIS — G43909 Migraine, unspecified, not intractable, without status migrainosus: Secondary | ICD-10-CM | POA: Diagnosis not present

## 2020-09-18 DIAGNOSIS — Z008 Encounter for other general examination: Secondary | ICD-10-CM | POA: Diagnosis not present

## 2020-09-18 DIAGNOSIS — N400 Enlarged prostate without lower urinary tract symptoms: Secondary | ICD-10-CM | POA: Diagnosis not present

## 2020-09-18 DIAGNOSIS — J309 Allergic rhinitis, unspecified: Secondary | ICD-10-CM | POA: Diagnosis not present

## 2020-09-18 DIAGNOSIS — K5900 Constipation, unspecified: Secondary | ICD-10-CM | POA: Diagnosis not present

## 2020-09-18 DIAGNOSIS — I10 Essential (primary) hypertension: Secondary | ICD-10-CM | POA: Diagnosis not present

## 2020-09-18 DIAGNOSIS — H04129 Dry eye syndrome of unspecified lacrimal gland: Secondary | ICD-10-CM | POA: Diagnosis not present

## 2020-09-18 DIAGNOSIS — G8929 Other chronic pain: Secondary | ICD-10-CM | POA: Diagnosis not present

## 2020-09-18 DIAGNOSIS — M1990 Unspecified osteoarthritis, unspecified site: Secondary | ICD-10-CM | POA: Diagnosis not present

## 2020-12-03 DIAGNOSIS — H524 Presbyopia: Secondary | ICD-10-CM | POA: Diagnosis not present

## 2020-12-21 ENCOUNTER — Ambulatory Visit: Payer: Self-pay | Attending: Internal Medicine

## 2020-12-21 ENCOUNTER — Other Ambulatory Visit: Payer: Self-pay

## 2020-12-21 ENCOUNTER — Other Ambulatory Visit (HOSPITAL_BASED_OUTPATIENT_CLINIC_OR_DEPARTMENT_OTHER): Payer: Self-pay

## 2020-12-21 DIAGNOSIS — Z23 Encounter for immunization: Secondary | ICD-10-CM

## 2020-12-21 MED ORDER — PFIZER-BIONT COVID-19 VAC-TRIS 30 MCG/0.3ML IM SUSP
INTRAMUSCULAR | 0 refills | Status: AC
  Filled 2020-12-21: qty 0.3, 1d supply, fill #0

## 2020-12-21 NOTE — Progress Notes (Addendum)
   Covid-19 Vaccination Clinic  Name:  [name]    MRN: [id number] DOB: 1946-05-22  12/21/2020  [name] was observed post Covid-19 immunization for 15 minutes without incident. He was provided with Vaccine Information Sheet and instruction to access the V-Safe system.   [name] was instructed to call 911 with any severe reactions post vaccine: Difficulty breathing  Swelling of face and throat  A fast heartbeat  A bad rash all over body  Dizziness and weakness       Covid-19 Vaccination Clinic  Name:  [name]    MRN: [id number] DOB: 1946/01/29  12/21/2020  [name] was observed post Covid-19 immunization for 15 minutes without incident. He was provided with Vaccine Information Sheet and instruction to access the V-Safe system.   [name] was instructed to call 911 with any severe reactions post vaccine: Difficulty breathing  Swelling of face and throat  A fast heartbeat  A bad rash all over body  Dizziness and weakness   Immunizations Administered     Name Date Dose VIS Date Route   PFIZER Comrnaty(Gray TOP) Covid-19 Vaccine 12/21/2020  1:24 PM 0.3 mL 05/21/2020 Intramuscular   Manufacturer: [place]   Lot: [id number]   [place]: [phone]

## 2021-02-26 DIAGNOSIS — Z23 Encounter for immunization: Secondary | ICD-10-CM | POA: Diagnosis not present

## 2021-05-27 DIAGNOSIS — Z0000 Encounter for general adult medical examination without abnormal findings: Secondary | ICD-10-CM | POA: Diagnosis not present

## 2021-05-27 DIAGNOSIS — I10 Essential (primary) hypertension: Secondary | ICD-10-CM | POA: Diagnosis not present

## 2021-05-27 DIAGNOSIS — R5383 Other fatigue: Secondary | ICD-10-CM | POA: Diagnosis not present

## 2021-06-03 DIAGNOSIS — N3943 Post-void dribbling: Secondary | ICD-10-CM | POA: Diagnosis not present

## 2021-06-03 DIAGNOSIS — N401 Enlarged prostate with lower urinary tract symptoms: Secondary | ICD-10-CM | POA: Diagnosis not present

## 2021-06-03 DIAGNOSIS — I10 Essential (primary) hypertension: Secondary | ICD-10-CM | POA: Diagnosis not present

## 2021-06-03 DIAGNOSIS — Z0000 Encounter for general adult medical examination without abnormal findings: Secondary | ICD-10-CM | POA: Diagnosis not present

## 2021-06-03 DIAGNOSIS — N3941 Urge incontinence: Secondary | ICD-10-CM | POA: Diagnosis not present

## 2021-06-15 ENCOUNTER — Ambulatory Visit: Payer: Self-pay | Attending: Internal Medicine

## 2021-06-15 ENCOUNTER — Other Ambulatory Visit (HOSPITAL_BASED_OUTPATIENT_CLINIC_OR_DEPARTMENT_OTHER): Payer: Self-pay

## 2021-06-15 DIAGNOSIS — Z23 Encounter for immunization: Secondary | ICD-10-CM

## 2021-06-15 MED ORDER — PFIZER COVID-19 VAC BIVALENT 30 MCG/0.3ML IM SUSP
INTRAMUSCULAR | 0 refills | Status: AC
  Filled 2021-06-15: qty 0.3, 1d supply, fill #0

## 2021-06-15 NOTE — Progress Notes (Signed)
° °  Covid-19 Vaccination Clinic  Name:  JAICION LAURIE    MRN: [id number] DOB: 18-Jan-1946  06/15/2021  Mr. Whitenack was observed post Covid-19 immunization for 15 minutes without incident. He was provided with Vaccine Information Sheet and instruction to access the V-Safe system.   Mr. Radin was instructed to call 911 with any severe reactions post vaccine: Difficulty breathing  Swelling of face and throat  A fast heartbeat  A bad rash all over body  Dizziness and weakness   Immunizations Administered     Name Date Dose VIS Date Route   Pfizer Covid-19 Vaccine Bivalent Booster 06/15/2021 11:35 AM 0.3 mL 02/10/2021 Intramuscular   Manufacturer: [place]   Lot: [id number]   [place]: [phone]

## 2021-07-09 DIAGNOSIS — R3915 Urgency of urination: Secondary | ICD-10-CM | POA: Diagnosis not present

## 2021-07-09 DIAGNOSIS — Z8042 Family history of malignant neoplasm of prostate: Secondary | ICD-10-CM | POA: Diagnosis not present

## 2021-07-09 DIAGNOSIS — Z125 Encounter for screening for malignant neoplasm of prostate: Secondary | ICD-10-CM | POA: Diagnosis not present

## 2021-07-09 DIAGNOSIS — R3912 Poor urinary stream: Secondary | ICD-10-CM | POA: Diagnosis not present

## 2021-07-09 DIAGNOSIS — N5201 Erectile dysfunction due to arterial insufficiency: Secondary | ICD-10-CM | POA: Diagnosis not present

## 2021-07-13 ENCOUNTER — Other Ambulatory Visit (HOSPITAL_COMMUNITY): Payer: Self-pay

## 2021-07-13 MED ORDER — TADALAFIL 5 MG PO TABS
ORAL_TABLET | ORAL | 3 refills | Status: AC
  Filled 2021-07-13: qty 30, 30d supply, fill #0
  Filled 2021-09-07: qty 30, 30d supply, fill #1
  Filled 2021-10-18: qty 30, 30d supply, fill #2
  Filled 2021-12-03: qty 30, 30d supply, fill #3

## 2021-08-14 ENCOUNTER — Other Ambulatory Visit: Payer: Self-pay

## 2021-08-14 ENCOUNTER — Emergency Department (HOSPITAL_BASED_OUTPATIENT_CLINIC_OR_DEPARTMENT_OTHER)
Admission: EM | Admit: 2021-08-14 | Discharge: 2021-08-14 | Disposition: A | Discharge status: Home / Self Care | Payer: Medicare HMO | Attending: Emergency Medicine | Admitting: Emergency Medicine

## 2021-08-14 ENCOUNTER — Encounter (HOSPITAL_BASED_OUTPATIENT_CLINIC_OR_DEPARTMENT_OTHER): Payer: Self-pay | Admitting: Emergency Medicine

## 2021-08-14 DIAGNOSIS — M545 Low back pain, unspecified: Secondary | ICD-10-CM | POA: Insufficient documentation

## 2021-08-14 DIAGNOSIS — M5459 Other low back pain: Secondary | ICD-10-CM | POA: Diagnosis not present

## 2021-08-14 HISTORY — DX: Essential (primary) hypertension: I10

## 2021-08-14 NOTE — ED Triage Notes (Signed)
Pt endorses lower back pain that started yesterday. Pt started on Myrbetriq for 3 weeks and not sure if its related. Denies any injury.  ?

## 2021-08-14 NOTE — Discharge Instructions (Signed)
Please use Tylenol or ibuprofen for pain.  You may use 600 mg ibuprofen every 6 hours or 1000 mg of Tylenol every 6 hours.  You may choose to alternate between the 2.  This would be most effective.  Not to exceed 4 g of Tylenol within 24 hours.  Not to exceed 3200 mg ibuprofen 24 hours. ? ?In addition I recommend ice, or heat to the affected area.  You can use the tizanidine as prescribed for pain that does not respond to the above.  If you have continued pain despite all of this treatment in 1 to 2 weeks, with rest I recommend following up with orthopedics for further evaluation.  Please follow-up with your urologist to decide whether or not to continue the Myrbetriq medication since back pain can be a side effect. ?

## 2021-08-14 NOTE — ED Provider Notes (Signed)
?[name] ?Provider Note ? ? ?CSN: [id number] ?Arrival date & time: 08/14/21  1315 ? ?  ? ?History ? ?Chief Complaint  ?Patient presents with  ? Back Pain  ? ? ?EDYN QAZI is a 76 y.o. male with no signet past medical history presents with concern for left lower back pain that started yesterday.  Patient reports that he started on Myrbetriq around 3 weeks ago and is concerned because this was one of the side effects.  He denies any known injury.  He does report that he was doing some yard work which may have exacerbated it.  Patient reports that he has had some previous injury of the lower back around 20 years ago but no recent injury.  He has had no back surgery.  He denies any IV drug use, chronic corticosteroid use, fever, pain waking him out from sleep, history of cancer.  He denies any numbness or tingling, numbness in the groin, urinary incontinence, fecal incontinence.  He took some arthritis strength Tylenol, as well as tizanidine prior to arrival and reports some improvement of pain. ? ? ?Back Pain ? ?  ? ?Home Medications ?Prior to Admission medications   ?Medication Sig Start Date End Date Taking? Authorizing Provider  ?aspirin 81 MG tablet Take 325 mg by mouth daily.      [provider]  ?COVID-19 mRNA bivalent vaccine, Pfizer, (PFIZER COVID-19 VAC BIVALENT) injection Inject into the muscle. 06/15/21   Carlyle Basques, MD  ?COVID-19 mRNA Vac-TriS, Pfizer, (PFIZER-BIONT COVID-19 VAC-TRIS) SUSP injection Inject into the muscle. 12/21/20   Carlyle Basques, MD  ?SUMAtriptan (IMITREX) 25 MG tablet Take 25 mg by mouth every 2 (two) hours as needed.      [provider]  ?tadalafil (CIALIS) 5 MG tablet Take 1 tablet by mouth daily. 07/09/21   Robley Fries, MD  ?   ? ?Allergies    ?Patient has no known allergies.   ? ?Review of Systems   ?Review of Systems  ?Musculoskeletal:  Positive for back pain.  ?All other systems reviewed and are negative. ? ?Physical  Exam ?Updated Vital Signs ?BP (!) 144/71 (BP Location: Right Arm)   Pulse 91   Temp 98 ?F (36.7 ?C)   Resp 18   Ht '5\' 8"'$  (1.727 m)   Wt 81.6 kg   SpO2 97%   BMI 27.37 kg/m?  ?Physical Exam ?Vitals and nursing note reviewed.  ?Constitutional:   ?   General: He is not in acute distress. ?   Appearance: Normal appearance.  ?HENT:  ?   Head: Normocephalic and atraumatic.  ?Eyes:  ?   General:     ?   Right eye: No discharge.     ?   Left eye: No discharge.  ?Cardiovascular:  ?   Rate and Rhythm: Normal rate and regular rhythm.  ?   Pulses: Normal pulses.  ?Pulmonary:  ?   Effort: Pulmonary effort is normal. No respiratory distress.  ?Musculoskeletal:     ?   General: No deformity.  ?   Comments: Patient with some paraspinous lower lumbar back pain with no midline spinal tenderness throughout the entirety of the spine.  He has intact range of motion of the lumbar, thoracic, cervical spine.  Romberg negative, gait normal.  He has intact strength 5 out of 5 bilateral lower extremities.  ?Skin: ?   General: Skin is warm and dry.  ?   Capillary Refill: Capillary refill takes less than 2  seconds.  ?Neurological:  ?   Mental Status: He is alert and oriented to person, place, and time.  ?Psychiatric:     ?   Mood and Affect: Mood normal.     ?   Behavior: Behavior normal.  ? ? ?ED Results / Procedures / Treatments   ?Labs ?(all labs ordered are listed, but only abnormal results are displayed) ?Labs Reviewed - No data to display ? ?EKG ?None ? ?Radiology ?No results found. ? ?Procedures ?Procedures  ? ? ?Medications Ordered in ED ?Medications - No data to display ? ?ED Course/ Medical Decision Making/ A&P ?  ?                        ?Medical Decision Making ? ?There is an overall well-appearing 76 year old male who presents with cute onset left lower back pain that he rates 8/10 with movement.  He denies any specific injury.  He has no history of cancer, IV drug use, chronic corticosteroid use, any symptoms of saddle  anesthesia, urinary incontinence.  Members differential diagnosis includes compression fracture, spinal epidural abscess, versus other.  This is not an exhaustive differential.  On physical exam patient with paraspinous lumbar muscle tenderness with no midline spinal tenderness.  He is well-appearing with no fever.  He has no red flags for back pain.  He reports some improvement after arthritis strength Tylenol and tizanidine earlier today.  Encouraged him to continue at home pain medication, add on ibuprofen for anti-inflammatory effects, as well as ice, heat.  Encourage follow-up with orthopedics if pain does not resolve in 1 to 2 weeks.  Patient understands agrees to plan, discharged in stable condition, he is ambulatory at time of discharge. ?Final Clinical Impression(s) / ED Diagnoses ?Final diagnoses:  ?Acute left-sided low back pain without sciatica  ? ? ?Rx / DC Orders ?ED Discharge Orders   ? ? None  ? ?  ? ? ?  ?Anselmo Pickler, PA-C ?08/14/21 1352 ? ?  ?Sherwood Gambler, MD ?08/15/21 [phone] ? ?

## 2021-08-14 NOTE — ED Notes (Signed)
Dc instructions reviewed with patient. Patient voiced understanding. Dc with belongings.  °

## 2021-08-17 DIAGNOSIS — R3915 Urgency of urination: Secondary | ICD-10-CM | POA: Diagnosis not present

## 2021-08-17 DIAGNOSIS — N400 Enlarged prostate without lower urinary tract symptoms: Secondary | ICD-10-CM | POA: Diagnosis not present

## 2021-08-23 ENCOUNTER — Ambulatory Visit: Payer: Medicare HMO | Admitting: Family Medicine

## 2021-08-30 DIAGNOSIS — N529 Male erectile dysfunction, unspecified: Secondary | ICD-10-CM | POA: Diagnosis not present

## 2021-08-30 DIAGNOSIS — N400 Enlarged prostate without lower urinary tract symptoms: Secondary | ICD-10-CM | POA: Diagnosis not present

## 2021-08-30 DIAGNOSIS — N3943 Post-void dribbling: Secondary | ICD-10-CM | POA: Diagnosis not present

## 2021-08-30 DIAGNOSIS — Z87891 Personal history of nicotine dependence: Secondary | ICD-10-CM | POA: Diagnosis not present

## 2021-08-30 DIAGNOSIS — G8929 Other chronic pain: Secondary | ICD-10-CM | POA: Diagnosis not present

## 2021-08-30 DIAGNOSIS — I10 Essential (primary) hypertension: Secondary | ICD-10-CM | POA: Diagnosis not present

## 2021-08-30 DIAGNOSIS — K5900 Constipation, unspecified: Secondary | ICD-10-CM | POA: Diagnosis not present

## 2021-08-30 DIAGNOSIS — Z791 Long term (current) use of non-steroidal anti-inflammatories (NSAID): Secondary | ICD-10-CM | POA: Diagnosis not present

## 2021-08-30 DIAGNOSIS — Z008 Encounter for other general examination: Secondary | ICD-10-CM | POA: Diagnosis not present

## 2021-08-30 DIAGNOSIS — Z809 Family history of malignant neoplasm, unspecified: Secondary | ICD-10-CM | POA: Diagnosis not present

## 2021-08-30 DIAGNOSIS — Z8249 Family history of ischemic heart disease and other diseases of the circulatory system: Secondary | ICD-10-CM | POA: Diagnosis not present

## 2021-08-30 DIAGNOSIS — Z833 Family history of diabetes mellitus: Secondary | ICD-10-CM | POA: Diagnosis not present

## 2021-08-30 DIAGNOSIS — M792 Neuralgia and neuritis, unspecified: Secondary | ICD-10-CM | POA: Diagnosis not present

## 2021-08-31 ENCOUNTER — Encounter: Payer: Self-pay | Admitting: Family Medicine

## 2021-08-31 ENCOUNTER — Ambulatory Visit: Payer: Medicare HMO | Admitting: Family Medicine

## 2021-08-31 VITALS — BP 128/70 | Ht 68.0 in | Wt 180.0 lb

## 2021-08-31 DIAGNOSIS — M5416 Radiculopathy, lumbar region: Secondary | ICD-10-CM | POA: Diagnosis not present

## 2021-08-31 MED ORDER — PREDNISONE 5 MG PO TABS: ORAL_TABLET | ORAL | 0 refills | Status: AC

## 2021-08-31 NOTE — Patient Instructions (Signed)
Nice to meet you ?Please try heat  ?Please try the exercise   ?Please send me a message in MyChart with any questions or updates.  ?Please see me back in 2-3 weeks.  ? ?--Dr. Raeford Razor ? ?

## 2021-08-31 NOTE — Assessment & Plan Note (Signed)
Acutely occurring.  Symptoms more consistent with a nerve impingement. ?-Counseled on home exercise therapy and supportive care. ?-Prednisone. ?-Could consider physical therapy or further imaging. ?

## 2021-08-31 NOTE — Progress Notes (Signed)
?  JAVONNIE ILLESCAS - 76 y.o. male MRN [id number]  Date of birth: 05-30-1946 ? ?SUBJECTIVE:  Including CC & ROS.  ?No chief complaint on file. ? ? ?FREDIS MALKIEWICZ is a 76 y.o. male that is presenting with acute left-sided lateral leg pain.  The pain has been ongoing for a few weeks.  The pain is worse in the morning.  No injury or inciting event.  No history of surgery.  Has been taking ibuprofen. ? ?Review of the note from 3/4 shows he was counseled on supportive care ?Independent review of the CT abdomen and pelvis from 2010 shows mild hip osteoarthritis bilaterally with no significant changes in the lumbar spine. ? ?Review of Systems ?See HPI  ? ?HISTORY: Past Medical, Surgical, Social, and Family History Reviewed & Updated per EMR.   ?Pertinent Historical Findings include: ? ?Past Medical History:  ?Diagnosis Date  ? Chronic kidney disease   ? Hypertension   ? Migraine   ? ? ?Past Surgical History:  ?Procedure Laterality Date  ? IRRIGATION AND DEBRIDEMENT SEBACEOUS CYST  11/23/2010  ? sebaceous cyst on left neck  ? ? ? ?PHYSICAL EXAM:  ?VS: BP 128/70 (BP Location: Left Arm, Patient Position: Sitting)   Ht '5\' 8"'$  (1.727 m)   Wt 180 lb (81.6 kg)   BMI 27.37 kg/m?  ?Physical Exam ?Gen: NAD, alert, cooperative with exam, well-appearing ?MSK:  ?Neurovascularly intact   ? ? ? ? ?ASSESSMENT & PLAN:  ? ?Lumbar radiculopathy ?Acutely occurring.  Symptoms more consistent with a nerve impingement. ?-Counseled on home exercise therapy and supportive care. ?-Prednisone. ?-Could consider physical therapy or further imaging. ? ? ? ? ?

## 2021-09-07 ENCOUNTER — Other Ambulatory Visit (HOSPITAL_COMMUNITY): Payer: Self-pay

## 2021-09-21 ENCOUNTER — Encounter: Payer: Self-pay | Admitting: Family Medicine

## 2021-09-21 ENCOUNTER — Ambulatory Visit: Payer: Medicare HMO | Admitting: Family Medicine

## 2021-09-21 VITALS — BP 156/80 | Ht 68.0 in | Wt 180.0 lb

## 2021-09-21 DIAGNOSIS — M5416 Radiculopathy, lumbar region: Secondary | ICD-10-CM | POA: Diagnosis not present

## 2021-09-21 DIAGNOSIS — M65321 Trigger finger, right index finger: Secondary | ICD-10-CM

## 2021-09-21 NOTE — Progress Notes (Signed)
?  [name] - 76 y.o. male MRN [id number]  Date of birth: [birth year] ? ?SUBJECTIVE:  Including CC & ROS.  ?No chief complaint on file. ? ? ?[name] is a 76 y.o. male that is following up for his radicular pain presenting with right index trigger finger.  He has been doing well with his lower back and leg pain.  He has been doing the exercises and noticed improvement since the medication use.  His trigger finger is worse early in the morning. ? ? ? ?Review of Systems ?See HPI  ? ?HISTORY: Past Medical, Surgical, Social, and Family History Reviewed & Updated per EMR.   ?Pertinent Historical Findings include: ? ?Past Medical History:  ?Diagnosis Date  ? Chronic kidney disease   ? Hypertension   ? Migraine   ? ? ?Past Surgical History:  ?Procedure Laterality Date  ? IRRIGATION AND DEBRIDEMENT SEBACEOUS CYST  11/23/2010  ? sebaceous cyst on left neck  ? ? ? ?PHYSICAL EXAM:  ?VS: BP (!) 156/80 (BP Location: Left Arm, Patient Position: Sitting)   Ht '5\' 8"'$  (1.727 m)   Wt 180 lb (81.6 kg)   BMI 27.37 kg/m?  ?Physical Exam ?Gen: NAD, alert, cooperative with exam, well-appearing ?MSK:  ?Neurovascularly intact   ? ? ? ? ?ASSESSMENT & PLAN:  ? ?Lumbar radiculopathy ?Doing well since the medication.  He is able to do more of his normal activities with no pain. ?-Counseled on home exercise therapy and supportive care. ?-Could consider physical therapy. ? ?Trigger finger, right index finger ?Acutely occurring.  Intermittent in nature.  Notices it worse in the morning. ?-Counseled on home exercise therapy and supportive care. ?-Provided splint. ?-Could consider injection. ? ? ? ? ?

## 2021-09-21 NOTE — Assessment & Plan Note (Signed)
Acutely occurring.  Intermittent in nature.  Notices it worse in the morning. ?-Counseled on home exercise therapy and supportive care. ?-Provided splint. ?-Could consider injection. ?

## 2021-09-21 NOTE — Assessment & Plan Note (Signed)
Doing well since the medication.  He is able to do more of his normal activities with no pain. ?-Counseled on home exercise therapy and supportive care. ?-Could consider physical therapy. ?

## 2021-09-27 ENCOUNTER — Other Ambulatory Visit (HOSPITAL_COMMUNITY): Payer: Self-pay

## 2021-10-18 ENCOUNTER — Other Ambulatory Visit (HOSPITAL_COMMUNITY): Payer: Self-pay

## 2021-10-18 MED ORDER — MIRABEGRON ER 50 MG PO TB24
50.0000 mg | ORAL_TABLET | Freq: Every day | ORAL | 1 refills | Status: DC
  Filled 2021-10-18: qty 30, 30d supply, fill #0
  Filled 2021-12-03: qty 30, 30d supply, fill #1

## 2021-12-03 ENCOUNTER — Other Ambulatory Visit (HOSPITAL_COMMUNITY): Payer: Self-pay

## 2021-12-04 ENCOUNTER — Other Ambulatory Visit (HOSPITAL_COMMUNITY): Payer: Self-pay

## 2022-02-03 ENCOUNTER — Other Ambulatory Visit (HOSPITAL_COMMUNITY): Payer: Self-pay

## 2022-02-03 ENCOUNTER — Other Ambulatory Visit: Payer: Self-pay

## 2022-02-03 DIAGNOSIS — N3943 Post-void dribbling: Secondary | ICD-10-CM | POA: Diagnosis not present

## 2022-02-03 DIAGNOSIS — I10 Essential (primary) hypertension: Secondary | ICD-10-CM | POA: Diagnosis not present

## 2022-02-03 DIAGNOSIS — N401 Enlarged prostate with lower urinary tract symptoms: Secondary | ICD-10-CM | POA: Diagnosis not present

## 2022-02-04 ENCOUNTER — Other Ambulatory Visit (HOSPITAL_COMMUNITY): Payer: Self-pay

## 2022-02-10 DIAGNOSIS — N3943 Post-void dribbling: Secondary | ICD-10-CM | POA: Diagnosis not present

## 2022-02-10 DIAGNOSIS — I10 Essential (primary) hypertension: Secondary | ICD-10-CM | POA: Diagnosis not present

## 2022-02-10 DIAGNOSIS — N401 Enlarged prostate with lower urinary tract symptoms: Secondary | ICD-10-CM | POA: Diagnosis not present

## 2022-02-10 DIAGNOSIS — N3941 Urge incontinence: Secondary | ICD-10-CM | POA: Diagnosis not present

## 2022-02-11 DIAGNOSIS — R3915 Urgency of urination: Secondary | ICD-10-CM | POA: Diagnosis not present

## 2022-02-11 DIAGNOSIS — R3912 Poor urinary stream: Secondary | ICD-10-CM | POA: Diagnosis not present

## 2022-02-11 DIAGNOSIS — N5201 Erectile dysfunction due to arterial insufficiency: Secondary | ICD-10-CM | POA: Diagnosis not present

## 2022-02-11 DIAGNOSIS — N401 Enlarged prostate with lower urinary tract symptoms: Secondary | ICD-10-CM | POA: Diagnosis not present

## 2022-03-08 ENCOUNTER — Other Ambulatory Visit (HOSPITAL_COMMUNITY): Payer: Self-pay

## 2022-03-11 DIAGNOSIS — Z23 Encounter for immunization: Secondary | ICD-10-CM | POA: Diagnosis not present

## 2022-03-23 ENCOUNTER — Other Ambulatory Visit (HOSPITAL_COMMUNITY): Payer: Self-pay

## 2022-03-23 MED ORDER — TADALAFIL 5 MG PO TABS
5.0000 mg | ORAL_TABLET | Freq: Every day | ORAL | 6 refills | Status: DC
  Filled 2022-03-23: qty 30, 30d supply, fill #0
  Filled 2022-05-19: qty 30, 30d supply, fill #1
  Filled 2022-09-19: qty 30, 30d supply, fill #2
  Filled 2022-12-06: qty 30, 30d supply, fill #3
  Filled 2023-03-02: qty 30, 30d supply, fill #4

## 2022-03-25 ENCOUNTER — Other Ambulatory Visit (HOSPITAL_COMMUNITY): Payer: Self-pay

## 2022-05-02 ENCOUNTER — Other Ambulatory Visit (HOSPITAL_COMMUNITY): Payer: Self-pay

## 2022-05-19 ENCOUNTER — Other Ambulatory Visit (HOSPITAL_COMMUNITY): Payer: Self-pay

## 2022-05-19 MED ORDER — MIRABEGRON ER 50 MG PO TB24
50.0000 mg | ORAL_TABLET | Freq: Every day | ORAL | 1 refills | Status: DC
  Filled 2022-05-19: qty 30, 30d supply, fill #0

## 2022-05-23 ENCOUNTER — Other Ambulatory Visit (HOSPITAL_COMMUNITY): Payer: Self-pay

## 2022-07-05 DIAGNOSIS — Z0000 Encounter for general adult medical examination without abnormal findings: Secondary | ICD-10-CM | POA: Diagnosis not present

## 2022-07-05 DIAGNOSIS — N401 Enlarged prostate with lower urinary tract symptoms: Secondary | ICD-10-CM | POA: Diagnosis not present

## 2022-07-05 DIAGNOSIS — N3941 Urge incontinence: Secondary | ICD-10-CM | POA: Diagnosis not present

## 2022-07-05 DIAGNOSIS — R5383 Other fatigue: Secondary | ICD-10-CM | POA: Diagnosis not present

## 2022-07-05 DIAGNOSIS — N3943 Post-void dribbling: Secondary | ICD-10-CM | POA: Diagnosis not present

## 2022-07-05 DIAGNOSIS — I10 Essential (primary) hypertension: Secondary | ICD-10-CM | POA: Diagnosis not present

## 2022-07-06 DIAGNOSIS — Z125 Encounter for screening for malignant neoplasm of prostate: Secondary | ICD-10-CM | POA: Diagnosis not present

## 2022-07-12 DIAGNOSIS — Z0000 Encounter for general adult medical examination without abnormal findings: Secondary | ICD-10-CM | POA: Diagnosis not present

## 2022-07-12 DIAGNOSIS — N3943 Post-void dribbling: Secondary | ICD-10-CM | POA: Diagnosis not present

## 2022-07-12 DIAGNOSIS — I10 Essential (primary) hypertension: Secondary | ICD-10-CM | POA: Diagnosis not present

## 2022-07-12 DIAGNOSIS — M25569 Pain in unspecified knee: Secondary | ICD-10-CM | POA: Diagnosis not present

## 2022-07-12 DIAGNOSIS — E782 Mixed hyperlipidemia: Secondary | ICD-10-CM | POA: Diagnosis not present

## 2022-07-12 DIAGNOSIS — N3941 Urge incontinence: Secondary | ICD-10-CM | POA: Diagnosis not present

## 2022-07-12 DIAGNOSIS — N401 Enlarged prostate with lower urinary tract symptoms: Secondary | ICD-10-CM | POA: Diagnosis not present

## 2022-07-13 DIAGNOSIS — N401 Enlarged prostate with lower urinary tract symptoms: Secondary | ICD-10-CM | POA: Diagnosis not present

## 2022-07-13 DIAGNOSIS — R3915 Urgency of urination: Secondary | ICD-10-CM | POA: Diagnosis not present

## 2022-07-13 DIAGNOSIS — N5201 Erectile dysfunction due to arterial insufficiency: Secondary | ICD-10-CM | POA: Diagnosis not present

## 2022-07-13 DIAGNOSIS — R3912 Poor urinary stream: Secondary | ICD-10-CM | POA: Diagnosis not present

## 2022-07-26 DIAGNOSIS — M25569 Pain in unspecified knee: Secondary | ICD-10-CM | POA: Diagnosis not present

## 2022-07-26 DIAGNOSIS — E782 Mixed hyperlipidemia: Secondary | ICD-10-CM | POA: Diagnosis not present

## 2022-08-30 DIAGNOSIS — H524 Presbyopia: Secondary | ICD-10-CM | POA: Diagnosis not present

## 2022-08-30 DIAGNOSIS — H2513 Age-related nuclear cataract, bilateral: Secondary | ICD-10-CM | POA: Diagnosis not present

## 2022-09-07 DIAGNOSIS — H524 Presbyopia: Secondary | ICD-10-CM | POA: Diagnosis not present

## 2022-09-07 DIAGNOSIS — H52223 Regular astigmatism, bilateral: Secondary | ICD-10-CM | POA: Diagnosis not present

## 2022-09-19 ENCOUNTER — Other Ambulatory Visit: Payer: Self-pay

## 2022-09-21 ENCOUNTER — Other Ambulatory Visit (HOSPITAL_COMMUNITY): Payer: Self-pay

## 2022-09-28 ENCOUNTER — Encounter: Payer: Self-pay | Admitting: *Deleted

## 2022-10-04 DIAGNOSIS — N400 Enlarged prostate without lower urinary tract symptoms: Secondary | ICD-10-CM | POA: Diagnosis not present

## 2022-10-04 DIAGNOSIS — M1711 Unilateral primary osteoarthritis, right knee: Secondary | ICD-10-CM | POA: Diagnosis not present

## 2022-10-04 DIAGNOSIS — N3281 Overactive bladder: Secondary | ICD-10-CM | POA: Diagnosis not present

## 2022-10-04 DIAGNOSIS — E785 Hyperlipidemia, unspecified: Secondary | ICD-10-CM | POA: Diagnosis not present

## 2022-10-04 DIAGNOSIS — N3941 Urge incontinence: Secondary | ICD-10-CM | POA: Diagnosis not present

## 2022-10-04 DIAGNOSIS — E782 Mixed hyperlipidemia: Secondary | ICD-10-CM | POA: Diagnosis not present

## 2022-10-04 DIAGNOSIS — Z833 Family history of diabetes mellitus: Secondary | ICD-10-CM | POA: Diagnosis not present

## 2022-10-04 DIAGNOSIS — N529 Male erectile dysfunction, unspecified: Secondary | ICD-10-CM | POA: Diagnosis not present

## 2022-10-04 DIAGNOSIS — Z8042 Family history of malignant neoplasm of prostate: Secondary | ICD-10-CM | POA: Diagnosis not present

## 2022-10-04 DIAGNOSIS — Z8249 Family history of ischemic heart disease and other diseases of the circulatory system: Secondary | ICD-10-CM | POA: Diagnosis not present

## 2022-10-04 DIAGNOSIS — Z87891 Personal history of nicotine dependence: Secondary | ICD-10-CM | POA: Diagnosis not present

## 2022-10-04 DIAGNOSIS — I10 Essential (primary) hypertension: Secondary | ICD-10-CM | POA: Diagnosis not present

## 2022-10-11 DIAGNOSIS — N3943 Post-void dribbling: Secondary | ICD-10-CM | POA: Diagnosis not present

## 2022-10-11 DIAGNOSIS — N3941 Urge incontinence: Secondary | ICD-10-CM | POA: Diagnosis not present

## 2022-10-11 DIAGNOSIS — E782 Mixed hyperlipidemia: Secondary | ICD-10-CM | POA: Diagnosis not present

## 2022-10-11 DIAGNOSIS — I10 Essential (primary) hypertension: Secondary | ICD-10-CM | POA: Diagnosis not present

## 2022-10-11 DIAGNOSIS — N401 Enlarged prostate with lower urinary tract symptoms: Secondary | ICD-10-CM | POA: Diagnosis not present

## 2022-10-13 DIAGNOSIS — K5730 Diverticulosis of large intestine without perforation or abscess without bleeding: Secondary | ICD-10-CM | POA: Diagnosis not present

## 2022-10-13 DIAGNOSIS — Z09 Encounter for follow-up examination after completed treatment for conditions other than malignant neoplasm: Secondary | ICD-10-CM | POA: Diagnosis not present

## 2022-10-13 DIAGNOSIS — D123 Benign neoplasm of transverse colon: Secondary | ICD-10-CM | POA: Diagnosis not present

## 2022-10-13 DIAGNOSIS — D122 Benign neoplasm of ascending colon: Secondary | ICD-10-CM | POA: Diagnosis not present

## 2022-10-13 DIAGNOSIS — Z86010 Personal history of colonic polyps: Secondary | ICD-10-CM | POA: Diagnosis not present

## 2022-10-17 DIAGNOSIS — D123 Benign neoplasm of transverse colon: Secondary | ICD-10-CM | POA: Diagnosis not present

## 2022-10-17 DIAGNOSIS — D122 Benign neoplasm of ascending colon: Secondary | ICD-10-CM | POA: Diagnosis not present

## 2022-11-26 ENCOUNTER — Ambulatory Visit
Admission: EM | Admit: 2022-11-26 | Discharge: 2022-11-26 | Disposition: A | Discharge status: Home / Self Care | Payer: Medicare HMO | Attending: Internal Medicine | Admitting: Internal Medicine

## 2022-11-26 DIAGNOSIS — Z20818 Contact with and (suspected) exposure to other bacterial communicable diseases: Secondary | ICD-10-CM

## 2022-11-26 DIAGNOSIS — J029 Acute pharyngitis, unspecified: Secondary | ICD-10-CM

## 2022-11-26 DIAGNOSIS — J069 Acute upper respiratory infection, unspecified: Secondary | ICD-10-CM | POA: Diagnosis not present

## 2022-11-26 LAB — POCT RAPID STREP A (OFFICE): Rapid Strep A Screen: NEGATIVE

## 2022-11-26 NOTE — ED Triage Notes (Signed)
Pt states that he has a sore throat and nasal congestion. X2 days  Pt states that he was exposed to strep.

## 2022-11-26 NOTE — Discharge Instructions (Signed)
Rapid strep was negative.  Throat culture is pending.  Will call if it is abnormal.  As we discussed, this is most likely viral in nature and should run its course.  Follow-up if any symptoms persist or worsen.

## 2022-11-26 NOTE — ED Provider Notes (Signed)
[facility]    CSN: [id number] Arrival date & time: 11/26/22  1040      History   Chief Complaint Chief Complaint  Patient presents with   Nasal Congestion    Nasal congestion and sore throat. X2 days    HPI SHANKAR STULL is a 77 y.o. male.   Patient presents with nasal congestion, sore throat, minimal cough that started about 2 days ago.  Patient denies any known fevers.  Reports that his grandchild recently tested positive for strep throat.  He is taking Coricidin over-the-counter for symptoms.  Denies chest pain, shortness of breath.  Denies history of asthma or COPD and patient does not smoke cigarettes.     Past Medical History:  Diagnosis Date   Chronic kidney disease    Hypertension    Migraine     Patient Active Problem List   Diagnosis Date Noted   Trigger finger, right index finger 09/21/2021   Lumbar radiculopathy 08/31/2021    Past Surgical History:  Procedure Laterality Date   IRRIGATION AND DEBRIDEMENT SEBACEOUS CYST  11/23/2010   sebaceous cyst on left neck       Home Medications    Prior to Admission medications   Medication Sig Start Date End Date Taking? Authorizing Provider  aspirin 81 MG tablet Take 325 mg by mouth daily.     Yes [provider]  COVID-19 mRNA Vac-TriS, Pfizer, (PFIZER-BIONT COVID-19 VAC-TRIS) SUSP injection Inject into the muscle. 12/21/20  Yes Judyann Munson, MD  mirabegron ER (MYRBETRIQ) 50 MG TB24 tablet Take 1 tablet (50 mg total) by mouth daily. 05/19/22  Yes   predniSONE (DELTASONE) 5 MG tablet Take 6 pills for first day, 5 pills second day, 4 pills third day, 3 pills fourth day, 2 pills the fifth day, and 1 pill sixth day. 08/31/21  Yes Myra Rude, MD  SUMAtriptan (IMITREX) 25 MG tablet Take 25 mg by mouth every 2 (two) hours as needed.     Yes [provider]  tadalafil (CIALIS) 5 MG tablet Take 1 tablet by mouth daily. 07/09/21  Yes Pace, Weyman Croon, MD  COVID-19 mRNA bivalent  vaccine, Pfizer, (PFIZER COVID-19 VAC BIVALENT) injection Inject into the muscle. 06/15/21   Judyann Munson, MD  tadalafil (CIALIS) 5 MG tablet Take 1 tablet (5 mg total) by mouth daily. 03/23/22       Family History Family History  Problem Relation Age of Onset   Diabetes Mother    Hypertension Mother    Cancer Father    Heart disease Father    Heart disease Brother     Social History Social History   Tobacco Use   Smoking status: Never   Smokeless tobacco: Never  Substance Use Topics   Alcohol use: Yes     Allergies   Patient has no known allergies.   Review of Systems Review of Systems Per HPI  Physical Exam Triage Vital Signs ED Triage Vitals  Enc Vitals Group     BP 11/26/22 1105 131/69     Pulse Rate 11/26/22 1105 90     Resp 11/26/22 1105 17     Temp 11/26/22 1105 98.2 F (36.8 C)     Temp src --      SpO2 11/26/22 1105 95 %     Weight 11/26/22 1104 180 lb (81.6 kg)     Height 11/26/22 1104 5\' 8"  (1.727 m)     Head Circumference --      Peak Flow --  Pain Score 11/26/22 1104 3     Pain Loc --      Pain Edu? --      Excl. in GC? --    No data found.  Updated Vital Signs BP 131/69 (BP Location: Right Arm)   Pulse 90   Temp 98.2 F (36.8 C)   Resp 17   Ht 5\' 8"  (1.727 m)   Wt 180 lb (81.6 kg)   SpO2 95%   BMI 27.37 kg/m   Visual Acuity Right Eye Distance:   Left Eye Distance:   Bilateral Distance:    Right Eye Near:   Left Eye Near:    Bilateral Near:     Physical Exam Constitutional:      General: He is not in acute distress.    Appearance: Normal appearance. He is not toxic-appearing or diaphoretic.  HENT:     Head: Normocephalic and atraumatic.     Right Ear: Tympanic membrane and ear canal normal.     Left Ear: Tympanic membrane and ear canal normal.     Nose: Congestion present.     Mouth/Throat:     Mouth: Mucous membranes are moist.     Pharynx: Posterior oropharyngeal erythema present.  Eyes:     Extraocular  Movements: Extraocular movements intact.     Conjunctiva/sclera: Conjunctivae normal.     Pupils: Pupils are equal, round, and reactive to light.  Cardiovascular:     Rate and Rhythm: Normal rate and regular rhythm.     Pulses: Normal pulses.     Heart sounds: Normal heart sounds.  Pulmonary:     Effort: Pulmonary effort is normal. No respiratory distress.     Breath sounds: Normal breath sounds. No stridor. No wheezing, rhonchi or rales.  Abdominal:     General: Abdomen is flat. Bowel sounds are normal.     Palpations: Abdomen is soft.  Musculoskeletal:        General: Normal range of motion.     Cervical back: Normal range of motion.  Skin:    General: Skin is warm and dry.  Neurological:     General: No focal deficit present.     Mental Status: He is alert and oriented to person, place, and time. Mental status is at baseline.  Psychiatric:        Mood and Affect: Mood normal.        Behavior: Behavior normal.      UC Treatments / Results  Labs (all labs ordered are listed, but only abnormal results are displayed) Labs Reviewed  CULTURE, GROUP A STREP [facility])  POCT RAPID STREP A (OFFICE)    EKG   Radiology No results found.  Procedures Procedures (including critical care time)  Medications Ordered in UC Medications - No data to display  Initial Impression / Assessment and Plan / UC Course  I have reviewed the triage vital signs and the nursing notes.  Pertinent labs & imaging results that were available during my care of the patient were reviewed by me and considered in my medical decision making (see chart for details).     Patient presents with symptoms likely from a viral upper respiratory infection.  Do not suspect underlying cardiopulmonary process. Symptoms seem unlikely related to ACS, CHF or COPD exacerbations, pneumonia, pneumothorax. Patient is nontoxic appearing and not in need of emergent medical intervention.  Rapid strep is negative.  Throat  culture pending.  Patient declined COVID testing.  I have a low suspicion for strep  throat given appearance of posterior pharynx on exam so will await throat culture for any further treatment.  Recommended symptom control with over the counter medications.  Discussed safe options with patient.  Return if symptoms fail to improve in 1-2 weeks or you develop shortness of breath, chest pain, severe headache. Patient states understanding and is agreeable.  Discharged with PCP followup.  Final Clinical Impressions(s) / UC Diagnoses   Final diagnoses:  Viral upper respiratory tract infection with cough  Sore throat  Exposure to strep throat     Discharge Instructions      Rapid strep was negative.  Throat culture is pending.  Will call if it is abnormal.  As we discussed, this is most likely viral in nature and should run its course.  Follow-up if any symptoms persist or worsen.    ED Prescriptions   None    PDMP not reviewed this encounter.   Gustavus Bryant, [place] 11/26/22 1202

## 2022-11-27 LAB — CULTURE, GROUP A STREP (THRC)

## 2022-11-29 LAB — CULTURE, GROUP A STREP (THRC)

## 2022-12-02 DIAGNOSIS — J069 Acute upper respiratory infection, unspecified: Secondary | ICD-10-CM | POA: Diagnosis not present

## 2022-12-06 ENCOUNTER — Other Ambulatory Visit (HOSPITAL_COMMUNITY): Payer: Self-pay

## 2022-12-07 ENCOUNTER — Other Ambulatory Visit (HOSPITAL_COMMUNITY): Payer: Self-pay

## 2022-12-08 ENCOUNTER — Other Ambulatory Visit (HOSPITAL_COMMUNITY): Payer: Self-pay

## 2022-12-22 DIAGNOSIS — H6980 Other specified disorders of Eustachian tube, unspecified ear: Secondary | ICD-10-CM | POA: Diagnosis not present

## 2022-12-22 DIAGNOSIS — H9192 Unspecified hearing loss, left ear: Secondary | ICD-10-CM | POA: Diagnosis not present

## 2023-01-17 DIAGNOSIS — N3943 Post-void dribbling: Secondary | ICD-10-CM | POA: Diagnosis not present

## 2023-01-17 DIAGNOSIS — I10 Essential (primary) hypertension: Secondary | ICD-10-CM | POA: Diagnosis not present

## 2023-01-17 DIAGNOSIS — N3941 Urge incontinence: Secondary | ICD-10-CM | POA: Diagnosis not present

## 2023-01-17 DIAGNOSIS — N401 Enlarged prostate with lower urinary tract symptoms: Secondary | ICD-10-CM | POA: Diagnosis not present

## 2023-01-24 DIAGNOSIS — N3941 Urge incontinence: Secondary | ICD-10-CM | POA: Diagnosis not present

## 2023-01-24 DIAGNOSIS — N401 Enlarged prostate with lower urinary tract symptoms: Secondary | ICD-10-CM | POA: Diagnosis not present

## 2023-01-24 DIAGNOSIS — N3943 Post-void dribbling: Secondary | ICD-10-CM | POA: Diagnosis not present

## 2023-01-24 DIAGNOSIS — I10 Essential (primary) hypertension: Secondary | ICD-10-CM | POA: Diagnosis not present

## 2023-01-24 DIAGNOSIS — E782 Mixed hyperlipidemia: Secondary | ICD-10-CM | POA: Diagnosis not present

## 2023-02-17 DIAGNOSIS — Z23 Encounter for immunization: Secondary | ICD-10-CM | POA: Diagnosis not present

## 2023-03-02 ENCOUNTER — Other Ambulatory Visit (HOSPITAL_BASED_OUTPATIENT_CLINIC_OR_DEPARTMENT_OTHER): Payer: Self-pay

## 2023-03-02 ENCOUNTER — Other Ambulatory Visit (HOSPITAL_COMMUNITY): Payer: Self-pay

## 2023-03-07 ENCOUNTER — Other Ambulatory Visit (HOSPITAL_COMMUNITY): Payer: Self-pay

## 2023-03-08 DIAGNOSIS — Z23 Encounter for immunization: Secondary | ICD-10-CM | POA: Diagnosis not present

## 2023-03-08 DIAGNOSIS — I10 Essential (primary) hypertension: Secondary | ICD-10-CM | POA: Diagnosis not present

## 2023-04-25 DIAGNOSIS — I10 Essential (primary) hypertension: Secondary | ICD-10-CM | POA: Diagnosis not present

## 2023-05-02 DIAGNOSIS — N401 Enlarged prostate with lower urinary tract symptoms: Secondary | ICD-10-CM | POA: Diagnosis not present

## 2023-05-02 DIAGNOSIS — N3941 Urge incontinence: Secondary | ICD-10-CM | POA: Diagnosis not present

## 2023-05-02 DIAGNOSIS — I10 Essential (primary) hypertension: Secondary | ICD-10-CM | POA: Diagnosis not present

## 2023-05-02 DIAGNOSIS — N3943 Post-void dribbling: Secondary | ICD-10-CM | POA: Diagnosis not present

## 2023-05-02 DIAGNOSIS — N182 Chronic kidney disease, stage 2 (mild): Secondary | ICD-10-CM | POA: Diagnosis not present

## 2023-05-02 DIAGNOSIS — E782 Mixed hyperlipidemia: Secondary | ICD-10-CM | POA: Diagnosis not present

## 2023-06-01 ENCOUNTER — Other Ambulatory Visit (HOSPITAL_COMMUNITY): Payer: Self-pay

## 2023-06-02 ENCOUNTER — Other Ambulatory Visit (HOSPITAL_COMMUNITY): Payer: Self-pay

## 2023-06-02 MED ORDER — TADALAFIL 5 MG PO TABS
5.0000 mg | ORAL_TABLET | Freq: Every day | ORAL | 6 refills | Status: DC
  Filled 2023-06-02: qty 30, 30d supply, fill #0
  Filled 2023-08-07: qty 30, 30d supply, fill #1
  Filled 2023-10-03: qty 30, 30d supply, fill #2
  Filled 2023-11-28: qty 30, 30d supply, fill #3
  Filled 2024-02-05: qty 30, 30d supply, fill #4
  Filled 2024-04-11: qty 30, 30d supply, fill #5

## 2023-06-13 ENCOUNTER — Other Ambulatory Visit (HOSPITAL_COMMUNITY): Payer: Self-pay

## 2023-07-25 DIAGNOSIS — E782 Mixed hyperlipidemia: Secondary | ICD-10-CM | POA: Diagnosis not present

## 2023-07-25 DIAGNOSIS — I10 Essential (primary) hypertension: Secondary | ICD-10-CM | POA: Diagnosis not present

## 2023-07-25 DIAGNOSIS — N401 Enlarged prostate with lower urinary tract symptoms: Secondary | ICD-10-CM | POA: Diagnosis not present

## 2023-07-25 DIAGNOSIS — R5383 Other fatigue: Secondary | ICD-10-CM | POA: Diagnosis not present

## 2023-08-01 DIAGNOSIS — N3943 Post-void dribbling: Secondary | ICD-10-CM | POA: Diagnosis not present

## 2023-08-01 DIAGNOSIS — I10 Essential (primary) hypertension: Secondary | ICD-10-CM | POA: Diagnosis not present

## 2023-08-01 DIAGNOSIS — N182 Chronic kidney disease, stage 2 (mild): Secondary | ICD-10-CM | POA: Diagnosis not present

## 2023-08-01 DIAGNOSIS — N401 Enlarged prostate with lower urinary tract symptoms: Secondary | ICD-10-CM | POA: Diagnosis not present

## 2023-08-01 DIAGNOSIS — E782 Mixed hyperlipidemia: Secondary | ICD-10-CM | POA: Diagnosis not present

## 2023-08-01 DIAGNOSIS — N3941 Urge incontinence: Secondary | ICD-10-CM | POA: Diagnosis not present

## 2023-08-01 DIAGNOSIS — K2100 Gastro-esophageal reflux disease with esophagitis, without bleeding: Secondary | ICD-10-CM | POA: Diagnosis not present

## 2023-08-01 DIAGNOSIS — Z0000 Encounter for general adult medical examination without abnormal findings: Secondary | ICD-10-CM | POA: Diagnosis not present

## 2023-08-07 ENCOUNTER — Other Ambulatory Visit (HOSPITAL_COMMUNITY): Payer: Self-pay

## 2023-10-03 ENCOUNTER — Other Ambulatory Visit: Payer: Self-pay

## 2023-10-10 ENCOUNTER — Other Ambulatory Visit (HOSPITAL_COMMUNITY): Payer: Self-pay

## 2023-10-10 MED ORDER — MIRABEGRON ER 50 MG PO TB24
50.0000 mg | ORAL_TABLET | Freq: Every day | ORAL | 1 refills | Status: DC
  Filled 2023-10-10 – 2023-10-16 (×2): qty 30, 30d supply, fill #0
  Filled 2023-11-28: qty 30, 30d supply, fill #1

## 2023-10-16 ENCOUNTER — Other Ambulatory Visit (HOSPITAL_COMMUNITY): Payer: Self-pay

## 2023-11-28 ENCOUNTER — Other Ambulatory Visit (HOSPITAL_COMMUNITY): Payer: Self-pay

## 2024-02-05 ENCOUNTER — Other Ambulatory Visit (HOSPITAL_COMMUNITY): Payer: Self-pay

## 2024-02-05 MED ORDER — MIRABEGRON ER 50 MG PO TB24
50.0000 mg | ORAL_TABLET | Freq: Every day | ORAL | 1 refills | Status: DC
  Filled 2024-02-05: qty 30, 30d supply, fill #0
  Filled 2024-04-11: qty 30, 30d supply, fill #1

## 2024-02-20 DIAGNOSIS — I10 Essential (primary) hypertension: Secondary | ICD-10-CM | POA: Diagnosis not present

## 2024-02-20 DIAGNOSIS — N401 Enlarged prostate with lower urinary tract symptoms: Secondary | ICD-10-CM | POA: Diagnosis not present

## 2024-02-20 DIAGNOSIS — E782 Mixed hyperlipidemia: Secondary | ICD-10-CM | POA: Diagnosis not present

## 2024-02-22 DIAGNOSIS — J320 Chronic maxillary sinusitis: Secondary | ICD-10-CM | POA: Diagnosis not present

## 2024-02-22 DIAGNOSIS — G9331 Postviral fatigue syndrome: Secondary | ICD-10-CM | POA: Diagnosis not present

## 2024-02-22 DIAGNOSIS — R058 Other specified cough: Secondary | ICD-10-CM | POA: Diagnosis not present

## 2024-04-11 ENCOUNTER — Other Ambulatory Visit: Payer: Self-pay

## 2024-06-14 ENCOUNTER — Other Ambulatory Visit (HOSPITAL_COMMUNITY): Payer: Self-pay

## 2024-06-20 ENCOUNTER — Other Ambulatory Visit (HOSPITAL_COMMUNITY): Payer: Self-pay

## 2024-06-21 ENCOUNTER — Other Ambulatory Visit (HOSPITAL_COMMUNITY): Payer: Self-pay

## 2024-06-21 ENCOUNTER — Other Ambulatory Visit: Payer: Self-pay

## 2024-06-21 MED ORDER — TADALAFIL 5 MG PO TABS
5.0000 mg | ORAL_TABLET | Freq: Every day | ORAL | 3 refills | Status: AC
  Filled 2024-06-21: qty 30, 30d supply, fill #0

## 2024-06-21 MED ORDER — MIRABEGRON ER 50 MG PO TB24
50.0000 mg | ORAL_TABLET | Freq: Every day | ORAL | 3 refills | Status: AC
  Filled 2024-06-21: qty 30, 30d supply, fill #0

## 2024-06-25 ENCOUNTER — Other Ambulatory Visit (HOSPITAL_COMMUNITY): Payer: Self-pay
